# Patient Record
Sex: Female | Born: 1985
Health system: Southern US, Community
[De-identification: ages and names within clinical notes are randomized; demographics above are authoritative.]

## PROBLEM LIST (undated history)

## (undated) DIAGNOSIS — E119 Type 2 diabetes mellitus without complications: Secondary | ICD-10-CM

## (undated) DIAGNOSIS — D649 Anemia, unspecified: Secondary | ICD-10-CM

## (undated) DIAGNOSIS — R000 Tachycardia, unspecified: Secondary | ICD-10-CM

## (undated) DIAGNOSIS — R0602 Shortness of breath: Secondary | ICD-10-CM

## (undated) DIAGNOSIS — N184 Chronic kidney disease, stage 4 (severe): Secondary | ICD-10-CM

## (undated) DIAGNOSIS — I10 Essential (primary) hypertension: Secondary | ICD-10-CM

## (undated) HISTORY — DX: Chronic kidney disease, stage 4 (severe): N18.4

## (undated) HISTORY — DX: Shortness of breath: R06.02

---
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
## 2005-05-23 ENCOUNTER — Ambulatory Visit (HOSPITAL_COMMUNITY): Admission: AD | Admit: 2005-05-23 | Discharge: 2005-05-23 | Payer: Self-pay | Admitting: Obstetrics & Gynecology

## 2005-05-27 ENCOUNTER — Ambulatory Visit (HOSPITAL_COMMUNITY): Admission: AD | Admit: 2005-05-27 | Discharge: 2005-05-27 | Payer: Self-pay | Admitting: Obstetrics & Gynecology

## 2005-05-30 ENCOUNTER — Ambulatory Visit (HOSPITAL_COMMUNITY): Admission: AD | Admit: 2005-05-30 | Discharge: 2005-05-30 | Payer: Self-pay | Admitting: Obstetrics & Gynecology

## 2005-06-03 ENCOUNTER — Ambulatory Visit (HOSPITAL_COMMUNITY): Admission: AD | Admit: 2005-06-03 | Discharge: 2005-06-03 | Payer: Self-pay | Admitting: Obstetrics & Gynecology

## 2005-06-06 ENCOUNTER — Ambulatory Visit (HOSPITAL_COMMUNITY): Admission: AD | Admit: 2005-06-06 | Discharge: 2005-06-06 | Payer: Self-pay | Admitting: Obstetrics & Gynecology

## 2005-06-10 ENCOUNTER — Ambulatory Visit (HOSPITAL_COMMUNITY): Admission: AD | Admit: 2005-06-10 | Discharge: 2005-06-10 | Payer: Self-pay | Admitting: Obstetrics & Gynecology

## 2005-06-17 ENCOUNTER — Ambulatory Visit (HOSPITAL_COMMUNITY): Admission: AD | Admit: 2005-06-17 | Discharge: 2005-06-17 | Payer: Self-pay | Admitting: Obstetrics & Gynecology

## 2005-06-25 ENCOUNTER — Inpatient Hospital Stay (HOSPITAL_COMMUNITY): Admission: RE | Admit: 2005-06-25 | Discharge: 2005-06-28 | Payer: Self-pay | Admitting: Obstetrics & Gynecology

## 2005-07-05 ENCOUNTER — Encounter (HOSPITAL_COMMUNITY): Admission: RE | Admit: 2005-07-05 | Discharge: 2005-07-06 | Payer: Self-pay | Admitting: Obstetrics & Gynecology

## 2005-07-07 ENCOUNTER — Encounter (HOSPITAL_COMMUNITY): Admission: RE | Admit: 2005-07-07 | Discharge: 2005-08-06 | Payer: Self-pay | Admitting: Obstetrics & Gynecology

## 2011-01-23 ENCOUNTER — Other Ambulatory Visit (HOSPITAL_COMMUNITY): Payer: Self-pay | Admitting: Obstetrics and Gynecology

## 2011-01-23 DIAGNOSIS — O24919 Unspecified diabetes mellitus in pregnancy, unspecified trimester: Secondary | ICD-10-CM

## 2011-01-28 ENCOUNTER — Other Ambulatory Visit (HOSPITAL_COMMUNITY): Payer: Self-pay | Admitting: Obstetrics and Gynecology

## 2011-01-28 ENCOUNTER — Encounter (HOSPITAL_COMMUNITY): Payer: Self-pay

## 2011-01-28 ENCOUNTER — Ambulatory Visit (HOSPITAL_COMMUNITY)
Admission: RE | Admit: 2011-01-28 | Discharge: 2011-01-28 | Disposition: A | Discharge status: Home / Self Care | Payer: Medicaid Other | Source: Ambulatory Visit | Attending: Obstetrics and Gynecology | Admitting: Obstetrics and Gynecology

## 2011-01-28 VITALS — BP 117/84 | HR 113 | Wt 169.0 lb

## 2011-01-28 DIAGNOSIS — O34219 Maternal care for unspecified type scar from previous cesarean delivery: Secondary | ICD-10-CM | POA: Insufficient documentation

## 2011-01-28 DIAGNOSIS — Z363 Encounter for antenatal screening for malformations: Secondary | ICD-10-CM | POA: Insufficient documentation

## 2011-01-28 DIAGNOSIS — O24919 Unspecified diabetes mellitus in pregnancy, unspecified trimester: Secondary | ICD-10-CM

## 2011-01-28 DIAGNOSIS — O358XX0 Maternal care for other (suspected) fetal abnormality and damage, not applicable or unspecified: Secondary | ICD-10-CM | POA: Insufficient documentation

## 2011-01-28 DIAGNOSIS — Z1389 Encounter for screening for other disorder: Secondary | ICD-10-CM | POA: Insufficient documentation

## 2011-01-28 DIAGNOSIS — O2620 Pregnancy care for patient with recurrent pregnancy loss, unspecified trimester: Secondary | ICD-10-CM | POA: Insufficient documentation

## 2011-01-29 ENCOUNTER — Encounter (HOSPITAL_COMMUNITY): Payer: Self-pay

## 2011-01-30 ENCOUNTER — Other Ambulatory Visit (HOSPITAL_COMMUNITY): Payer: Self-pay | Admitting: Obstetrics and Gynecology

## 2011-01-30 DIAGNOSIS — O2620 Pregnancy care for patient with recurrent pregnancy loss, unspecified trimester: Secondary | ICD-10-CM

## 2011-01-30 DIAGNOSIS — O34219 Maternal care for unspecified type scar from previous cesarean delivery: Secondary | ICD-10-CM

## 2011-01-30 DIAGNOSIS — O24919 Unspecified diabetes mellitus in pregnancy, unspecified trimester: Secondary | ICD-10-CM

## 2011-03-11 ENCOUNTER — Ambulatory Visit (HOSPITAL_COMMUNITY)
Admission: RE | Admit: 2011-03-11 | Discharge: 2011-03-11 | Disposition: A | Discharge status: Home / Self Care | Payer: Medicaid Other | Source: Ambulatory Visit | Attending: Obstetrics and Gynecology | Admitting: Obstetrics and Gynecology

## 2011-03-11 DIAGNOSIS — O34219 Maternal care for unspecified type scar from previous cesarean delivery: Secondary | ICD-10-CM | POA: Insufficient documentation

## 2011-03-11 DIAGNOSIS — O2620 Pregnancy care for patient with recurrent pregnancy loss, unspecified trimester: Secondary | ICD-10-CM | POA: Insufficient documentation

## 2011-03-11 DIAGNOSIS — O24919 Unspecified diabetes mellitus in pregnancy, unspecified trimester: Secondary | ICD-10-CM | POA: Insufficient documentation

## 2011-04-08 ENCOUNTER — Ambulatory Visit (HOSPITAL_COMMUNITY)
Admission: RE | Admit: 2011-04-08 | Discharge: 2011-04-08 | Disposition: A | Discharge status: Home / Self Care | Payer: Medicaid Other | Source: Ambulatory Visit | Attending: Obstetrics and Gynecology | Admitting: Obstetrics and Gynecology

## 2011-04-08 DIAGNOSIS — O24919 Unspecified diabetes mellitus in pregnancy, unspecified trimester: Secondary | ICD-10-CM | POA: Insufficient documentation

## 2011-04-08 DIAGNOSIS — O34219 Maternal care for unspecified type scar from previous cesarean delivery: Secondary | ICD-10-CM | POA: Insufficient documentation

## 2011-04-08 NOTE — Progress Notes (Signed)
Ultrasound in [facility].  Follow up U/S scheduled

## 2011-05-20 ENCOUNTER — Ambulatory Visit (HOSPITAL_COMMUNITY)
Admission: RE | Admit: 2011-05-20 | Discharge: 2011-05-20 | Disposition: A | Discharge status: Home / Self Care | Payer: Medicaid Other | Source: Ambulatory Visit | Attending: Obstetrics and Gynecology | Admitting: Obstetrics and Gynecology

## 2011-05-20 ENCOUNTER — Other Ambulatory Visit (HOSPITAL_COMMUNITY): Payer: Self-pay | Admitting: Obstetrics and Gynecology

## 2011-05-20 DIAGNOSIS — O34219 Maternal care for unspecified type scar from previous cesarean delivery: Secondary | ICD-10-CM | POA: Insufficient documentation

## 2011-05-20 DIAGNOSIS — O2620 Pregnancy care for patient with recurrent pregnancy loss, unspecified trimester: Secondary | ICD-10-CM | POA: Insufficient documentation

## 2011-05-20 DIAGNOSIS — O24919 Unspecified diabetes mellitus in pregnancy, unspecified trimester: Secondary | ICD-10-CM

## 2011-05-20 NOTE — Progress Notes (Signed)
Obstetric ultrasound performed today.  Please see report in [facility].

## 2012-12-26 DIAGNOSIS — R112 Nausea with vomiting, unspecified: Secondary | ICD-10-CM

## 2014-01-04 ENCOUNTER — Emergency Department (HOSPITAL_COMMUNITY): Payer: Medicaid Other

## 2014-01-04 ENCOUNTER — Emergency Department (HOSPITAL_COMMUNITY)
Admission: EM | Admit: 2014-01-04 | Discharge: 2014-01-04 | Disposition: A | Discharge status: Nursing Home (Includes ICF) | Payer: Medicaid Other | Source: Home / Self Care | Attending: Family Medicine | Admitting: Family Medicine

## 2014-01-04 ENCOUNTER — Encounter (HOSPITAL_COMMUNITY): Payer: Self-pay | Admitting: Emergency Medicine

## 2014-01-04 ENCOUNTER — Emergency Department (HOSPITAL_COMMUNITY)
Admission: EM | Admit: 2014-01-04 | Discharge: 2014-01-05 | Disposition: A | Discharge status: Home / Self Care | Payer: Medicaid Other | Attending: Emergency Medicine | Admitting: Emergency Medicine

## 2014-01-04 DIAGNOSIS — Z79899 Other long term (current) drug therapy: Secondary | ICD-10-CM | POA: Insufficient documentation

## 2014-01-04 DIAGNOSIS — J3489 Other specified disorders of nose and nasal sinuses: Secondary | ICD-10-CM | POA: Insufficient documentation

## 2014-01-04 DIAGNOSIS — Z794 Long term (current) use of insulin: Secondary | ICD-10-CM | POA: Insufficient documentation

## 2014-01-04 DIAGNOSIS — R319 Hematuria, unspecified: Secondary | ICD-10-CM | POA: Insufficient documentation

## 2014-01-04 DIAGNOSIS — R739 Hyperglycemia, unspecified: Secondary | ICD-10-CM

## 2014-01-04 DIAGNOSIS — R05 Cough: Secondary | ICD-10-CM | POA: Insufficient documentation

## 2014-01-04 DIAGNOSIS — E109 Type 1 diabetes mellitus without complications: Secondary | ICD-10-CM

## 2014-01-04 DIAGNOSIS — I10 Essential (primary) hypertension: Secondary | ICD-10-CM | POA: Insufficient documentation

## 2014-01-04 DIAGNOSIS — R059 Cough, unspecified: Secondary | ICD-10-CM | POA: Insufficient documentation

## 2014-01-04 DIAGNOSIS — R300 Dysuria: Secondary | ICD-10-CM | POA: Insufficient documentation

## 2014-01-04 DIAGNOSIS — E119 Type 2 diabetes mellitus without complications: Secondary | ICD-10-CM | POA: Insufficient documentation

## 2014-01-04 DIAGNOSIS — E1065 Type 1 diabetes mellitus with hyperglycemia: Secondary | ICD-10-CM

## 2014-01-04 HISTORY — DX: Type 2 diabetes mellitus without complications: E11.9

## 2014-01-04 HISTORY — DX: Essential (primary) hypertension: I10

## 2014-01-04 LAB — POCT URINALYSIS DIP (DEVICE)
Bilirubin Urine: NEGATIVE
Glucose, UA: >=1000 mg/dL — AB
Ketones, ur: NEGATIVE mg/dL
Leukocytes, UA: NEGATIVE
NITRITE: NEGATIVE
PH: 7 (ref 5.0–8.0)
PROTEIN: NEGATIVE mg/dL
Specific Gravity, Urine: 1.015 (ref 1.005–1.030)
UROBILINOGEN UA: 0.2 mg/dL (ref 0.0–1.0)

## 2014-01-04 LAB — BASIC METABOLIC PANEL
Anion gap: 15 (ref 5–15)
BUN: 10 mg/dL (ref 6–23)
CALCIUM: 9.4 mg/dL (ref 8.4–10.5)
CO2: 22 mEq/L (ref 19–32)
CREATININE: 0.5 mg/dL (ref 0.50–1.10)
Chloride: 99 mEq/L (ref 96–112)
Glucose, Bld: 367 mg/dL — ABNORMAL HIGH (ref 70–99)
POTASSIUM: 4.1 meq/L (ref 3.7–5.3)
Sodium: 136 mEq/L — ABNORMAL LOW (ref 137–147)

## 2014-01-04 LAB — CBC WITH DIFFERENTIAL/PLATELET
BASOS PCT: 0 % (ref 0–1)
Basophils Absolute: 0 10*3/uL (ref 0.0–0.1)
EOS ABS: 0 10*3/uL (ref 0.0–0.7)
Eosinophils Relative: 0 % (ref 0–5)
HCT: 36.7 % (ref 36.0–46.0)
HEMOGLOBIN: 12.3 g/dL (ref 12.0–15.0)
Lymphocytes Relative: 29 % (ref 12–46)
Lymphs Abs: 2.1 10*3/uL (ref 0.7–4.0)
MCH: 28.3 pg (ref 26.0–34.0)
MCHC: 33.5 g/dL (ref 30.0–36.0)
MCV: 84.4 fL (ref 78.0–100.0)
MONOS PCT: 11 % (ref 3–12)
Monocytes Absolute: 0.8 10*3/uL (ref 0.1–1.0)
NEUTROS PCT: 60 % (ref 43–77)
Neutro Abs: 4.3 10*3/uL (ref 1.7–7.7)
Platelets: 233 10*3/uL (ref 150–400)
RBC: 4.35 MIL/uL (ref 3.87–5.11)
RDW: 12 % (ref 11.5–15.5)
WBC: 7.2 10*3/uL (ref 4.0–10.5)

## 2014-01-04 LAB — POCT I-STAT, CHEM 8
BUN: 8 mg/dL (ref 6–23)
CREATININE: 0.7 mg/dL (ref 0.50–1.10)
Calcium, Ion: 1.2 mmol/L (ref 1.12–1.23)
Chloride: 99 mEq/L (ref 96–112)
GLUCOSE: 442 mg/dL — AB (ref 70–99)
HCT: 43 % (ref 36.0–46.0)
HEMOGLOBIN: 14.6 g/dL (ref 12.0–15.0)
POTASSIUM: 4 meq/L (ref 3.7–5.3)
Sodium: 136 mEq/L — ABNORMAL LOW (ref 137–147)
TCO2: 25 mmol/L (ref 0–100)

## 2014-01-04 LAB — CBG MONITORING, ED
GLUCOSE-CAPILLARY: 165 mg/dL — AB (ref 70–99)
GLUCOSE-CAPILLARY: 352 mg/dL — AB (ref 70–99)

## 2014-01-04 LAB — RAPID STREP SCREEN (MED CTR MEBANE ONLY): Streptococcus, Group A Screen (Direct): NEGATIVE

## 2014-01-04 LAB — POCT PREGNANCY, URINE: Preg Test, Ur: NEGATIVE

## 2014-01-04 MED ORDER — SODIUM CHLORIDE 0.9 % IV BOLUS (SEPSIS)
1000.0000 mL | Freq: Once | INTRAVENOUS | Status: AC
  Administered 2014-01-04: 1000 mL via INTRAVENOUS

## 2014-01-04 MED ORDER — INSULIN ASPART 100 UNIT/ML ~~LOC~~ SOLN
10.0000 [IU] | Freq: Once | SUBCUTANEOUS | Status: AC
  Administered 2014-01-04: 21:00:00 via INTRAVENOUS
  Filled 2014-01-04: qty 1

## 2014-01-04 NOTE — ED Provider Notes (Signed)
CSN: [id number]     Arrival date & time 01/04/14  1642 History   First MD Initiated Contact with Patient 01/04/14 1751     Chief Complaint  Patient presents with  . Urinary Tract Infection   (Consider location/radiation/quality/duration/timing/severity/associated sxs/prior Treatment) HPI Comments: Patient is T1 diabetic who has run out of test strips. No CBGs at home. Taking meds as prescribed.  Patient is a 28 y.o. female presenting with frequency. The history is provided by the patient.  Urinary Frequency This is a new problem. Episode onset: 4 days. The problem occurs constantly. The problem has not changed since onset.Associated symptoms include abdominal pain. Pertinent negatives include no headaches. Associated symptoms comments: +back pain and hematuria.    Past Medical History  Diagnosis Date  . Diabetes mellitus without complication   . Hypertension    History reviewed. No pertinent past surgical history. No family history on file. History  Substance Use Topics  . Smoking status: Never Smoker   . Smokeless tobacco: Not on file  . Alcohol Use: Yes   OB History   Grav Para Term Preterm Abortions TAB SAB Ect Mult Living   5 1 1  0 3 0 3 0 0 1     Review of Systems  Constitutional: Negative.  Negative for fever.  HENT: Negative.   Eyes: Negative.   Respiratory: Negative.   Cardiovascular: Negative.   Gastrointestinal: Positive for abdominal pain. Negative for nausea, vomiting, diarrhea, constipation and blood in stool.  Endocrine: Positive for polyuria.  Genitourinary: Positive for frequency and hematuria. Negative for dysuria, flank pain, vaginal bleeding, vaginal discharge, vaginal pain and pelvic pain.  Musculoskeletal: Positive for back pain.  Skin: Negative.   Neurological: Negative for dizziness, weakness, light-headedness and headaches.    Allergies  Review of patient's allergies indicates no known allergies.  Home Medications   Prior to Admission  medications   Medication Sig Start Date End Date Taking? Authorizing Provider  insulin glargine (LANTUS) 100 UNIT/ML injection Inject 15 Units into the skin at bedtime.    Yes Historical Provider, MD  insulin lispro (HUMALOG) 100 UNIT/ML injection Inject 5 Units into the skin 3 (three) times daily before meals.    Yes Historical Provider, MD  METOPROLOL TARTRATE PO Take by mouth.   Yes Historical Provider, MD  lisinopril (PRINIVIL,ZESTRIL) 20 MG tablet Take 20 mg by mouth daily.    Historical Provider, MD  metFORMIN (GLUCOPHAGE) 1000 MG tablet Take 1,000 mg by mouth 2 (two) times daily with a meal.      Historical Provider, MD  metoprolol (LOPRESSOR) 50 MG tablet Take 50 mg by mouth daily.    Historical Provider, MD   BP 140/97  Pulse 104  Temp(Src) 98.1 F (36.7 C) (Oral)  Resp 16  SpO2 98%  Breastfeeding? Unknown Physical Exam  Nursing note and vitals reviewed. Constitutional: She is oriented to person, place, and time. She appears well-developed and well-nourished. No distress.  HENT:  Head: Normocephalic and atraumatic.  Cardiovascular: Normal rate, regular rhythm and normal heart sounds.   Pulmonary/Chest: Effort normal and breath sounds normal.  Abdominal: Soft. Bowel sounds are normal. She exhibits no distension. There is no tenderness. There is no CVA tenderness.  Musculoskeletal: Normal range of motion.  Neurological: She is alert and oriented to person, place, and time.  Skin: Skin is warm and dry.  Psychiatric: She has a normal mood and affect. Her behavior is normal.    ED Course  Procedures (including critical care time) Labs Review  Labs Reviewed  POCT URINALYSIS DIP (DEVICE) - Abnormal; Notable for the following:    Glucose, UA >=1000 (*)    Hgb urine dipstick SMALL (*)    All other components within normal limits  POCT I-STAT, CHEM 8 - Abnormal; Notable for the following:    Sodium 136 (*)    Glucose, Bld 442 (*)    All other components within normal limits   POCT PREGNANCY, URINE    Imaging Review Dg Chest 2 View  01/04/2014   CLINICAL DATA:  Cough and sore throat for 2 day  EXAM: CHEST  2 VIEW  COMPARISON:  None.  FINDINGS: Normal heart size and mediastinal contours. No acute infiltrate or edema. No effusion or pneumothorax. No acute osseous findings.  IMPRESSION: No active cardiopulmonary disease.   Electronically Signed   By: Jorje Guild M.D.   On: 01/04/2014 21:55     MDM   1. Hyperglycemia due to type 1 diabetes mellitus   UA only notable for glucosuria. No ketones Istat 8 consistent with severe hyperglycemia without anion gap Will transfer to [facility] ER for glucose stabilization   [place], [place] 01/04/14 2223

## 2014-01-04 NOTE — Discharge Instructions (Signed)

## 2014-01-04 NOTE — ED Notes (Deleted)
Pt reports insect sting to lower lip this am around 1000 Sx include swollen lip and tenderness Denies dyspnea and SOB Alert w/no signs of acute distress.

## 2014-01-04 NOTE — ED Notes (Signed)
Pt in from urgent care, went there due to frequent urination, once labs were obtained pt glucose level was 442, pt is a diabetic and has been taking her medication, but hasn't checked her glucose at home in 3-4 months due to not having test strips. No distress noted in triage.

## 2014-01-04 NOTE — ED Provider Notes (Signed)
CSN: [id number]     Arrival date & time 01/04/14  1920 History   First MD Initiated Contact with Patient 01/04/14 2023     Chief Complaint  Patient presents with  . Hyperglycemia     (Consider location/radiation/quality/duration/timing/severity/associated sxs/prior Treatment) HPI Comments: Patient referred to the emergency department for evaluation of elevated blood sugar. Patient went to urgent care today to be evaluated for hematuria and dysuria. Her urinalysis was clear, but she was found to have elevated blood sugar. She has been taking her medication as prescribed, but has not been checking her sugar because to have test strips. She is now experiencing abdominal pain, nausea, vomiting or diarrhea. She does have nasal congestion, sore throat and cough.  Patient is a 28 y.o. female presenting with hyperglycemia.  Hyperglycemia Associated symptoms: dysuria     Past Medical History  Diagnosis Date  . Diabetes mellitus without complication   . Hypertension    History reviewed. No pertinent past surgical history. History reviewed. No pertinent family history. History  Substance Use Topics  . Smoking status: Never Smoker   . Smokeless tobacco: Not on file  . Alcohol Use: Yes   OB History   Grav Para Term Preterm Abortions TAB SAB Ect Mult Living   5 1 1  0 3 0 3 0 0 1     Review of Systems  HENT: Positive for congestion.   Respiratory: Positive for cough.   Genitourinary: Positive for dysuria and hematuria.  All other systems reviewed and are negative.     Allergies  Review of patient's allergies indicates no known allergies.  Home Medications   Prior to Admission medications   Medication Sig Start Date End Date Taking? Authorizing Provider  insulin glargine (LANTUS) 100 UNIT/ML injection Inject 15 Units into the skin at bedtime.    Yes Historical Provider, MD  insulin lispro (HUMALOG) 100 UNIT/ML injection Inject 5 Units into the skin 3 (three) times daily before  meals.    Yes Historical Provider, MD  lisinopril (PRINIVIL,ZESTRIL) 20 MG tablet Take 20 mg by mouth daily.   Yes Historical Provider, MD  metFORMIN (GLUCOPHAGE) 1000 MG tablet Take 1,000 mg by mouth 2 (two) times daily with a meal.     Yes Historical Provider, MD  metoprolol (LOPRESSOR) 50 MG tablet Take 50 mg by mouth daily.   Yes Historical Provider, MD  METOPROLOL TARTRATE PO Take by mouth.    Historical Provider, MD   BP 121/94  Pulse 105  Temp(Src) 98.1 F (36.7 C) (Oral)  Resp 14  Wt 168 lb (76.204 kg)  SpO2 99% Physical Exam  Constitutional: She is oriented to person, place, and time. She appears well-developed and well-nourished. No distress.  HENT:  Head: Normocephalic and atraumatic.  Right Ear: Hearing normal.  Left Ear: Hearing normal.  Nose: Nose normal.  Mouth/Throat: Oropharynx is clear and moist and mucous membranes are normal.  Eyes: Conjunctivae and EOM are normal. Pupils are equal, round, and reactive to light.  Neck: Normal range of motion. Neck supple.  Cardiovascular: Regular rhythm, S1 normal and S2 normal.  Exam reveals no gallop and no friction rub.   No murmur heard. Pulmonary/Chest: Effort normal and breath sounds normal. No respiratory distress. She exhibits no tenderness.  Abdominal: Soft. Normal appearance and bowel sounds are normal. There is no hepatosplenomegaly. There is no tenderness. There is no rebound, no guarding, no tenderness at McBurney's point and negative Murphy's sign. No hernia.  Musculoskeletal: Normal range of motion.  Neurological:  She is alert and oriented to person, place, and time. She has normal strength. No cranial nerve deficit or sensory deficit. Coordination normal. GCS eye subscore is 4. GCS verbal subscore is 5. GCS motor subscore is 6.  Skin: Skin is warm, dry and intact. No rash noted. No cyanosis.  Psychiatric: She has a normal mood and affect. Her speech is normal and behavior is normal. Thought content normal.    ED  Course  Procedures (including critical care time) Labs Review Labs Reviewed  BASIC METABOLIC PANEL - Abnormal; Notable for the following:    Sodium 136 (*)    Glucose, Bld 367 (*)    All other components within normal limits  CBG MONITORING, ED - Abnormal; Notable for the following:    Glucose-Capillary 352 (*)    All other components within normal limits  CBG MONITORING, ED - Abnormal; Notable for the following:    Glucose-Capillary 165 (*)    All other components within normal limits  RAPID STREP SCREEN  CULTURE, GROUP A STREP  CBC WITH DIFFERENTIAL    Imaging Review Dg Chest 2 View  01/04/2014   CLINICAL DATA:  Cough and sore throat for 2 day  EXAM: CHEST  2 VIEW  COMPARISON:  None.  FINDINGS: Normal heart size and mediastinal contours. No acute infiltrate or edema. No effusion or pneumothorax. No acute osseous findings.  IMPRESSION: No active cardiopulmonary disease.   Electronically Signed   By: Jorje Guild M.D.   On: 01/04/2014 21:55     EKG Interpretation   Date/Time:  Wednesday January 04 2014 20:14:14 EDT Ventricular Rate:  102 PR Interval:  144 QRS Duration: 69 QT Interval:  328 QTC Calculation: 427 R Axis:   72 Text Interpretation:  Sinus tachycardia No previous tracing Confirmed by  POLLINA  MD, CHRISTOPHER [phone]) on 01/04/2014 8:23:30 PM      MDM   Final diagnoses:  None   hyperglycemia  Patient presents to the ER for evaluation of elevated blood sugar. Her labs are unremarkable other than hyperglycemia. No evidence of DKA. Patient hydrated, medicated and is now 165. She'll be discharged, continue her current regimen, followup with primary doctor.    Orpah Greek, MD 01/04/14 [phone]

## 2014-01-04 NOTE — ED Notes (Signed)
Pt reports poss UTI onset Sunday Sx include: abd/back pain and hematuria Denies dysuria, urinary freq/urgency, gyn sx, f/v/n/d Alert w/no signs of acute distress.

## 2014-01-05 LAB — CULTURE, GROUP A STREP

## 2014-01-05 NOTE — ED Provider Notes (Signed)
Medical screening examination/treatment/procedure(s) were performed by a resident physician or non-physician practitioner and as the supervising physician I was immediately available for consultation/collaboration.  Lynne Leader, MD    Gregor Hams, MD 01/05/14 [phone]

## 2014-04-03 ENCOUNTER — Encounter (HOSPITAL_COMMUNITY): Payer: Self-pay | Admitting: Emergency Medicine

## 2014-04-03 ENCOUNTER — Emergency Department (HOSPITAL_COMMUNITY)
Admission: EM | Admit: 2014-04-03 | Discharge: 2014-04-03 | Disposition: A | Discharge status: Home / Self Care | Payer: Medicaid Other | Attending: Emergency Medicine | Admitting: Emergency Medicine

## 2014-04-03 DIAGNOSIS — S339XXA Sprain of unspecified parts of lumbar spine and pelvis, initial encounter: Secondary | ICD-10-CM | POA: Insufficient documentation

## 2014-04-03 DIAGNOSIS — Y92410 Unspecified street and highway as the place of occurrence of the external cause: Secondary | ICD-10-CM | POA: Insufficient documentation

## 2014-04-03 DIAGNOSIS — Y9389 Activity, other specified: Secondary | ICD-10-CM | POA: Diagnosis not present

## 2014-04-03 DIAGNOSIS — E119 Type 2 diabetes mellitus without complications: Secondary | ICD-10-CM | POA: Diagnosis not present

## 2014-04-03 DIAGNOSIS — S39012A Strain of muscle, fascia and tendon of lower back, initial encounter: Secondary | ICD-10-CM

## 2014-04-03 DIAGNOSIS — S335XXA Sprain of ligaments of lumbar spine, initial encounter: Principal | ICD-10-CM

## 2014-04-03 DIAGNOSIS — IMO0002 Reserved for concepts with insufficient information to code with codable children: Secondary | ICD-10-CM | POA: Insufficient documentation

## 2014-04-03 DIAGNOSIS — Z794 Long term (current) use of insulin: Secondary | ICD-10-CM | POA: Insufficient documentation

## 2014-04-03 DIAGNOSIS — I10 Essential (primary) hypertension: Secondary | ICD-10-CM | POA: Diagnosis not present

## 2014-04-03 DIAGNOSIS — Z79899 Other long term (current) drug therapy: Secondary | ICD-10-CM | POA: Diagnosis not present

## 2014-04-03 MED ORDER — METHOCARBAMOL 750 MG PO TABS: 750.0000 mg | ORAL_TABLET | Freq: Three times a day (TID) | ORAL | Status: DC | PRN

## 2014-04-03 MED ORDER — METHOCARBAMOL 500 MG PO TABS
750.0000 mg | ORAL_TABLET | Freq: Once | ORAL | Status: AC
  Administered 2014-04-03: 750 mg via ORAL
  Filled 2014-04-03: qty 2

## 2014-04-03 MED ORDER — NAPROXEN 375 MG PO TABS: 375.0000 mg | ORAL_TABLET | Freq: Three times a day (TID) | ORAL | Status: DC

## 2014-04-03 MED ORDER — NAPROXEN 250 MG PO TABS
375.0000 mg | ORAL_TABLET | Freq: Once | ORAL | Status: AC
  Administered 2014-04-03: 375 mg via ORAL
  Filled 2014-04-03: qty 2
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
## 2014-04-03 NOTE — Discharge Instructions (Signed)
Cryotherapy Cryotherapy is when you put ice on your injury. Ice helps lessen pain and puffiness (swelling) after an injury. Ice works the best when you start using it in the first 24 to 48 hours after an injury. HOME CARE  Put a dry or damp towel between the ice pack and your skin.  You may press gently on the ice pack.  Leave the ice on for no more than 10 to 20 minutes at a time.  Check your skin after 5 minutes to make sure your skin is okay.  Rest at least 20 minutes between ice pack uses.  Stop using ice when your skin loses feeling (numbness).  Do not use ice on someone who cannot tell you when it hurts. This includes small children and people with memory problems (dementia). GET HELP RIGHT AWAY IF:  You have white spots on your skin.  Your skin turns blue or pale.  Your skin feels waxy or hard.  Your puffiness gets worse. MAKE SURE YOU:   Understand these instructions.  Will watch your condition.  Will get help right away if you are not doing well or get worse. Document Released: 12/10/2007 Document Revised: 09/15/2011 Document Reviewed: 02/13/2011 [facility] Patient Information 2015 [place], [place]. This information is not intended to replace advice given to you by your health care provider. Make sure you discuss any questions you have with your health care provider.  Back Exercises Back exercises help treat and prevent back injuries. The goal is to increase your strength in your belly (abdominal) and back muscles. These exercises can also help with flexibility. Start these exercises when told by your doctor. HOME CARE Back exercises include: Pelvic Tilt.  Lie on your back with your knees bent. Tilt your pelvis until the lower part of your back is against the floor. Hold this position 5 to 10 sec. Repeat this exercise 5 to 10 times. Knee to Chest.  Pull 1 knee up against your chest and hold for 20 to 30 seconds. Repeat this with the other knee. This may be done with  the other leg straight or bent, whichever feels better. Then, pull both knees up against your chest. Sit-Ups or Curl-Ups.  Bend your knees 90 degrees. Start with tilting your pelvis, and do a partial, slow sit-up. Only lift your upper half 30 to 45 degrees off the floor. Take at least 2 to 3 seonds for each sit-up. Do not do sit-ups with your knees out straight. If partial sit-ups are difficult, simply do the above but with only tightening your belly (abdominal) muscles and holding it as told. Hip-Lift.  Lie on your back with your knees flexed 90 degrees. Push down with your feet and shoulders as you raise your hips 2 inches off the floor. Hold for 10 seconds, repeat 5 to 10 times. Back Arches.  Lie on your stomach. Prop yourself up on bent elbows. Slowly press on your hands, causing an arch in your low back. Repeat 3 to 5 times. Shoulder-Lifts.  Lie face down with arms beside your body. Keep hips and belly pressed to floor as you slowly lift your head and shoulders off the floor. Do not overdo your exercises. Be careful in the beginning. Exercises may cause you some mild back discomfort. If the pain lasts for more than 15 minutes, stop the exercises until you see your doctor. Improvement with exercise for back problems is slow.  Document Released: 07/26/2010 Document Revised: 09/15/2011 Document Reviewed: 04/24/2011 [employer] Patient Information 2015 [employer]. This information is not intended to replace advice given to you by your health care provider. Make sure you discuss any questions you have with your health care provider.

## 2014-04-05 NOTE — ED Provider Notes (Signed)
Medical screening examination/treatment/procedure(s) were performed by non-physician practitioner and as supervising physician I was immediately available for consultation/collaboration.   EKG Interpretation None       Babette Relic, MD 04/05/14 1319

## 2014-05-08 ENCOUNTER — Encounter (HOSPITAL_COMMUNITY): Payer: Self-pay | Admitting: Emergency Medicine

## 2014-05-10 ENCOUNTER — Encounter (HOSPITAL_COMMUNITY): Payer: Self-pay | Admitting: Emergency Medicine

## 2014-05-10 ENCOUNTER — Emergency Department (HOSPITAL_COMMUNITY)
Admission: EM | Admit: 2014-05-10 | Discharge: 2014-05-10 | Disposition: A | Discharge status: Home / Self Care | Payer: Medicaid Other | Attending: Emergency Medicine | Admitting: Emergency Medicine

## 2014-05-10 DIAGNOSIS — M549 Dorsalgia, unspecified: Secondary | ICD-10-CM | POA: Diagnosis present

## 2014-05-10 DIAGNOSIS — M545 Low back pain, unspecified: Secondary | ICD-10-CM

## 2014-05-10 DIAGNOSIS — I10 Essential (primary) hypertension: Secondary | ICD-10-CM | POA: Insufficient documentation

## 2014-05-10 DIAGNOSIS — E119 Type 2 diabetes mellitus without complications: Secondary | ICD-10-CM | POA: Diagnosis not present

## 2014-05-10 DIAGNOSIS — Z791 Long term (current) use of non-steroidal anti-inflammatories (NSAID): Secondary | ICD-10-CM | POA: Diagnosis not present

## 2014-05-10 DIAGNOSIS — Z794 Long term (current) use of insulin: Secondary | ICD-10-CM | POA: Diagnosis not present

## 2014-05-10 DIAGNOSIS — Z79899 Other long term (current) drug therapy: Secondary | ICD-10-CM | POA: Diagnosis not present

## 2014-05-10 MED ORDER — CYCLOBENZAPRINE HCL 10 MG PO TABS
10.0000 mg | ORAL_TABLET | Freq: Once | ORAL | Status: AC
  Administered 2014-05-10: 10 mg via ORAL
  Filled 2014-05-10: qty 1

## 2014-05-10 MED ORDER — NAPROXEN 500 MG PO TABS: 500.0000 mg | ORAL_TABLET | Freq: Two times a day (BID) | ORAL | Status: DC

## 2014-05-10 MED ORDER — CYCLOBENZAPRINE HCL 10 MG PO TABS: 10.0000 mg | ORAL_TABLET | Freq: Two times a day (BID) | ORAL | Status: DC | PRN

## 2014-05-10 MED ORDER — NAPROXEN 250 MG PO TABS
500.0000 mg | ORAL_TABLET | Freq: Once | ORAL | Status: AC
  Administered 2014-05-10: 500 mg via ORAL
  Filled 2014-05-10: qty 2
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
## 2014-05-10 NOTE — ED Notes (Signed)
Patient states heart rate always high, but she has not taken her Metoprolol for 2 months.

## 2014-05-10 NOTE — Discharge Instructions (Signed)
Please follow the directions provided. Be sure to follow-up with the Ortho referral given if your back pain does not improve. You may take the Naprosyn twice a day for pain and the Flexeril twice a day for muscle spasms. Don't hesitate to return for new, worsening, or concerning symptoms.  SEEK IMMEDIATE MEDICAL CARE IF:  You have pain that radiates from your back into your legs.  You develop new bowel or bladder control problems.  You have unusual weakness or numbness in your arms or legs.  You develop nausea or vomiting.  You develop abdominal pain.  You feel faint.
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
## 2014-05-14 ENCOUNTER — Encounter (HOSPITAL_COMMUNITY): Payer: Self-pay | Admitting: Emergency Medicine

## 2014-05-14 ENCOUNTER — Inpatient Hospital Stay (HOSPITAL_COMMUNITY)
Admission: EM | Admit: 2014-05-14 | Discharge: 2014-05-17 | DRG: 872 | Disposition: A | Discharge status: Home / Self Care | Payer: Medicaid Other | Attending: Internal Medicine | Admitting: Internal Medicine

## 2014-05-14 ENCOUNTER — Emergency Department (HOSPITAL_COMMUNITY): Payer: Medicaid Other

## 2014-05-14 ENCOUNTER — Emergency Department (HOSPITAL_COMMUNITY)
Admission: EM | Admit: 2014-05-14 | Discharge: 2014-05-14 | Disposition: A | Discharge status: Nursing Home (Includes ICF) | Payer: Medicaid Other | Source: Home / Self Care | Attending: Family Medicine | Admitting: Family Medicine

## 2014-05-14 ENCOUNTER — Encounter (HOSPITAL_COMMUNITY): Payer: Self-pay

## 2014-05-14 DIAGNOSIS — D649 Anemia, unspecified: Secondary | ICD-10-CM

## 2014-05-14 DIAGNOSIS — R10A1 Flank pain, right side: Secondary | ICD-10-CM

## 2014-05-14 DIAGNOSIS — D72829 Elevated white blood cell count, unspecified: Secondary | ICD-10-CM

## 2014-05-14 DIAGNOSIS — E1122 Type 2 diabetes mellitus with diabetic chronic kidney disease: Secondary | ICD-10-CM

## 2014-05-14 DIAGNOSIS — E1165 Type 2 diabetes mellitus with hyperglycemia: Secondary | ICD-10-CM | POA: Diagnosis present

## 2014-05-14 DIAGNOSIS — Z91120 Patient's intentional underdosing of medication regimen due to financial hardship: Secondary | ICD-10-CM | POA: Diagnosis present

## 2014-05-14 DIAGNOSIS — R000 Tachycardia, unspecified: Secondary | ICD-10-CM

## 2014-05-14 DIAGNOSIS — N12 Tubulo-interstitial nephritis, not specified as acute or chronic: Secondary | ICD-10-CM

## 2014-05-14 DIAGNOSIS — D500 Iron deficiency anemia secondary to blood loss (chronic): Secondary | ICD-10-CM | POA: Diagnosis present

## 2014-05-14 DIAGNOSIS — E119 Type 2 diabetes mellitus without complications: Secondary | ICD-10-CM

## 2014-05-14 DIAGNOSIS — A4151 Sepsis due to Escherichia coli [E. coli]: Principal | ICD-10-CM | POA: Diagnosis present

## 2014-05-14 DIAGNOSIS — R7881 Bacteremia: Secondary | ICD-10-CM | POA: Clinically undetermined

## 2014-05-14 DIAGNOSIS — R109 Unspecified abdominal pain: Secondary | ICD-10-CM

## 2014-05-14 DIAGNOSIS — Z794 Long term (current) use of insulin: Secondary | ICD-10-CM

## 2014-05-14 DIAGNOSIS — I10 Essential (primary) hypertension: Secondary | ICD-10-CM | POA: Diagnosis present

## 2014-05-14 DIAGNOSIS — N920 Excessive and frequent menstruation with regular cycle: Secondary | ICD-10-CM | POA: Diagnosis present

## 2014-05-14 DIAGNOSIS — E872 Acidosis: Secondary | ICD-10-CM | POA: Diagnosis present

## 2014-05-14 DIAGNOSIS — A419 Sepsis, unspecified organism: Secondary | ICD-10-CM

## 2014-05-14 HISTORY — DX: Tachycardia, unspecified: R00.0

## 2014-05-14 HISTORY — DX: Anemia, unspecified: D64.9

## 2014-05-14 LAB — I-STAT CG4 LACTIC ACID, ED: Lactic Acid, Venous: 1.96 mmol/L (ref 0.5–2.2)

## 2014-05-14 LAB — CBC WITH DIFFERENTIAL/PLATELET
Basophils Absolute: 0 10*3/uL (ref 0.0–0.1)
Basophils Absolute: 0 10*3/uL (ref 0.0–0.1)
Basophils Relative: 0 % (ref 0–1)
Basophils Relative: 0 % (ref 0–1)
EOS PCT: 0 % (ref 0–5)
EOS PCT: 0 % (ref 0–5)
Eosinophils Absolute: 0 10*3/uL (ref 0.0–0.7)
Eosinophils Absolute: 0 10*3/uL (ref 0.0–0.7)
HCT: 31.2 % — ABNORMAL LOW (ref 36.0–46.0)
HEMATOCRIT: 31.1 % — AB (ref 36.0–46.0)
Hemoglobin: 10.1 g/dL — ABNORMAL LOW (ref 12.0–15.0)
Hemoglobin: 9.8 g/dL — ABNORMAL LOW (ref 12.0–15.0)
LYMPHS ABS: 1.6 10*3/uL (ref 0.7–4.0)
LYMPHS ABS: 1.6 10*3/uL (ref 0.7–4.0)
Lymphocytes Relative: 8 % — ABNORMAL LOW (ref 12–46)
Lymphocytes Relative: 9 % — ABNORMAL LOW (ref 12–46)
MCH: 26.5 pg (ref 26.0–34.0)
MCH: 26 pg (ref 26.0–34.0)
MCHC: 32.4 g/dL (ref 30.0–36.0)
MCHC: 31.5 g/dL (ref 30.0–36.0)
MCV: 81.9 fL (ref 78.0–100.0)
MCV: 82.5 fL (ref 78.0–100.0)
MONO ABS: 1.2 10*3/uL — AB (ref 0.1–1.0)
MONO ABS: 1 10*3/uL (ref 0.1–1.0)
MONOS PCT: 6 % (ref 3–12)
Monocytes Relative: 5 % (ref 3–12)
Neutro Abs: 17 10*3/uL — ABNORMAL HIGH (ref 1.7–7.7)
Neutro Abs: 15.6 10*3/uL — ABNORMAL HIGH (ref 1.7–7.7)
Neutrophils Relative %: 86 % — ABNORMAL HIGH (ref 43–77)
Neutrophils Relative %: 86 % — ABNORMAL HIGH (ref 43–77)
Platelets: 342 10*3/uL (ref 150–400)
Platelets: 305 10*3/uL (ref 150–400)
RBC: 3.81 MIL/uL — AB (ref 3.87–5.11)
RBC: 3.77 MIL/uL — AB (ref 3.87–5.11)
RDW: 12.9 % (ref 11.5–15.5)
RDW: 13.2 % (ref 11.5–15.5)
WBC: 19.8 10*3/uL — ABNORMAL HIGH (ref 4.0–10.5)
WBC: 18.2 10*3/uL — ABNORMAL HIGH (ref 4.0–10.5)

## 2014-05-14 LAB — POCT PREGNANCY, URINE: Preg Test, Ur: NEGATIVE

## 2014-05-14 LAB — BASIC METABOLIC PANEL
Anion gap: 18 — ABNORMAL HIGH (ref 5–15)
BUN: 6 mg/dL (ref 6–23)
CO2: 21 meq/L (ref 19–32)
Calcium: 8.8 mg/dL (ref 8.4–10.5)
Chloride: 96 mEq/L (ref 96–112)
Creatinine, Ser: 0.56 mg/dL (ref 0.50–1.10)
GFR calc Af Amer: >90 mL/min (ref >90)
GLUCOSE: 254 mg/dL — AB (ref 70–99)
POTASSIUM: 4.4 meq/L (ref 3.7–5.3)
Sodium: 135 mEq/L — ABNORMAL LOW (ref 137–147)

## 2014-05-14 LAB — POCT URINALYSIS DIP (DEVICE)
Glucose, UA: 500 mg/dL — AB
Ketones, ur: 80 mg/dL — AB
Leukocytes, UA: NEGATIVE
NITRITE: NEGATIVE
PH: 6 (ref 5.0–8.0)
Protein, ur: >=300 mg/dL — AB
Specific Gravity, Urine: >=1.03 (ref 1.005–1.030)
Urobilinogen, UA: 4 mg/dL — ABNORMAL HIGH (ref 0.0–1.0)

## 2014-05-14 LAB — URINALYSIS, ROUTINE W REFLEX MICROSCOPIC
BILIRUBIN URINE: NEGATIVE
Leukocytes, UA: NEGATIVE
Nitrite: NEGATIVE
PROTEIN: 100 mg/dL — AB
Specific Gravity, Urine: 1.03 (ref 1.005–1.030)
Urobilinogen, UA: 1 mg/dL (ref 0.0–1.0)
pH: 6 (ref 5.0–8.0)

## 2014-05-14 LAB — URINE MICROSCOPIC-ADD ON

## 2014-05-14 LAB — TROPONIN I: Troponin I: <0.3 ng/mL (ref <0.30)

## 2014-05-14 LAB — I-STAT VENOUS BLOOD GAS, ED
Acid-base deficit: 4 mmol/L — ABNORMAL HIGH (ref 0.0–2.0)
Bicarbonate: 22.2 mEq/L (ref 20.0–24.0)
O2 SAT: 19 %
TCO2: 24 mmol/L (ref 0–100)
pCO2, Ven: 43.2 mmHg — ABNORMAL LOW (ref 45.0–50.0)
pH, Ven: 7.319 — ABNORMAL HIGH (ref 7.250–7.300)
pO2, Ven: 16 mmHg — CL (ref 30.0–45.0)

## 2014-05-14 LAB — LIPASE, BLOOD: Lipase: 7 U/L — ABNORMAL LOW (ref 11–59)

## 2014-05-14 LAB — KETONES, QUALITATIVE

## 2014-05-14 LAB — TSH: TSH: 0.919 u[IU]/mL (ref 0.350–4.500)

## 2014-05-14 LAB — POCT I-STAT, CHEM 8
BUN: 4 mg/dL — ABNORMAL LOW (ref 6–23)
Calcium, Ion: 1.15 mmol/L (ref 1.12–1.23)
Chloride: 100 mEq/L (ref 96–112)
Creatinine, Ser: 0.6 mg/dL (ref 0.50–1.10)
Glucose, Bld: 284 mg/dL — ABNORMAL HIGH (ref 70–99)
HCT: 34 % — ABNORMAL LOW (ref 36.0–46.0)
Hemoglobin: 11.6 g/dL — ABNORMAL LOW (ref 12.0–15.0)
Potassium: 3.9 mEq/L (ref 3.7–5.3)
SODIUM: 135 meq/L — AB (ref 137–147)
TCO2: 22 mmol/L (ref 0–100)

## 2014-05-14 LAB — D-DIMER, QUANTITATIVE: D-Dimer, Quant: 0.83 ug/mL-FEU — ABNORMAL HIGH (ref 0.00–0.48)

## 2014-05-14 MED ORDER — SODIUM CHLORIDE 0.9 % IV SOLN
INTRAVENOUS | Status: AC
  Administered 2014-05-14: via INTRAVENOUS

## 2014-05-14 MED ORDER — NAPROXEN 375 MG PO TABS
375.0000 mg | ORAL_TABLET | Freq: Three times a day (TID) | ORAL | Status: DC | PRN
  Administered 2014-05-15: 375 mg via ORAL
  Filled 2014-05-14 (×2): qty 1

## 2014-05-14 MED ORDER — INSULIN ASPART 100 UNIT/ML ~~LOC~~ SOLN
0.0000 [IU] | Freq: Three times a day (TID) | SUBCUTANEOUS | Status: DC
Start: 2014-05-15 — End: 2014-05-15
  Administered 2014-05-15: 5 [IU] via SUBCUTANEOUS

## 2014-05-14 MED ORDER — ONDANSETRON 4 MG PO TBDP
8.0000 mg | ORAL_TABLET | Freq: Once | ORAL | Status: AC
  Administered 2014-05-14: 8 mg via ORAL

## 2014-05-14 MED ORDER — METFORMIN HCL 500 MG PO TABS: 1000.0000 mg | ORAL_TABLET | Freq: Two times a day (BID) | ORAL | Status: DC

## 2014-05-14 MED ORDER — LISINOPRIL 20 MG PO TABS
20.0000 mg | ORAL_TABLET | Freq: Every day | ORAL | Status: DC
  Administered 2014-05-15: 20 mg via ORAL
  Filled 2014-05-14: qty 1

## 2014-05-14 MED ORDER — SODIUM CHLORIDE 0.9 % IV BOLUS (SEPSIS)
1000.0000 mL | Freq: Once | INTRAVENOUS | Status: AC
  Administered 2014-05-14: 1000 mL via INTRAVENOUS

## 2014-05-14 MED ORDER — ONDANSETRON 4 MG PO TBDP
ORAL_TABLET | ORAL | Status: AC
  Filled 2014-05-14: qty 2

## 2014-05-14 MED ORDER — ONDANSETRON HCL 4 MG/2ML IJ SOLN
4.0000 mg | Freq: Four times a day (QID) | INTRAMUSCULAR | Status: DC | PRN
  Administered 2014-05-15 (×2): 4 mg via INTRAVENOUS
  Filled 2014-05-14 (×2): qty 2

## 2014-05-14 MED ORDER — METOPROLOL TARTRATE 50 MG PO TABS
50.0000 mg | ORAL_TABLET | Freq: Two times a day (BID) | ORAL | Status: DC
  Administered 2014-05-14 – 2014-05-15 (×2): 50 mg via ORAL
  Filled 2014-05-14 (×3): qty 1

## 2014-05-14 MED ORDER — DEXTROSE 5 % IV SOLN
1.0000 g | INTRAVENOUS | Status: DC
  Filled 2014-05-14: qty 10

## 2014-05-14 MED ORDER — ENOXAPARIN SODIUM 40 MG/0.4ML ~~LOC~~ SOLN
40.0000 mg | SUBCUTANEOUS | Status: DC
  Administered 2014-05-14: 40 mg via SUBCUTANEOUS
  Filled 2014-05-14 (×4): qty 0.4

## 2014-05-14 MED ORDER — ACETAMINOPHEN 325 MG PO TABS
650.0000 mg | ORAL_TABLET | Freq: Four times a day (QID) | ORAL | Status: DC | PRN
  Administered 2014-05-14: 650 mg via ORAL

## 2014-05-14 MED ORDER — HYDROMORPHONE HCL 1 MG/ML IJ SOLN: 0.5000 mg | INTRAMUSCULAR | Status: DC | PRN

## 2014-05-14 MED ORDER — CYCLOBENZAPRINE HCL 10 MG PO TABS: 10.0000 mg | ORAL_TABLET | Freq: Two times a day (BID) | ORAL | Status: DC | PRN

## 2014-05-14 MED ORDER — METFORMIN HCL 500 MG PO TABS
1000.0000 mg | ORAL_TABLET | Freq: Two times a day (BID) | ORAL | Status: DC
  Administered 2014-05-15: 1000 mg via ORAL
  Filled 2014-05-14 (×3): qty 2

## 2014-05-14 MED ORDER — CEFTRIAXONE SODIUM 1 G IJ SOLR
1.0000 g | Freq: Once | INTRAMUSCULAR | Status: AC
  Administered 2014-05-14: 1 g via INTRAVENOUS
  Filled 2014-05-14: qty 10

## 2014-05-14 MED ORDER — ACETAMINOPHEN 650 MG RE SUPP: 650.0000 mg | Freq: Four times a day (QID) | RECTAL | Status: DC | PRN

## 2014-05-14 MED ORDER — INSULIN ASPART 100 UNIT/ML ~~LOC~~ SOLN
0.0000 [IU] | Freq: Every day | SUBCUTANEOUS | Status: DC
  Administered 2014-05-14: 3 [IU] via SUBCUTANEOUS

## 2014-05-14 MED ORDER — SODIUM CHLORIDE 0.9 % IJ SOLN
3.0000 mL | Freq: Two times a day (BID) | INTRAMUSCULAR | Status: DC
  Administered 2014-05-16 (×2): 3 mL via INTRAVENOUS

## 2014-05-14 MED ORDER — INSULIN GLARGINE 100 UNIT/ML ~~LOC~~ SOLN
15.0000 [IU] | Freq: Every day | SUBCUTANEOUS | Status: DC
  Administered 2014-05-14 – 2014-05-15 (×2): 15 [IU] via SUBCUTANEOUS
  Filled 2014-05-14 (×3): qty 0.15

## 2014-05-14 NOTE — ED Notes (Signed)
Patient states she is feeling nausea and having some flank pain That started today
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
## 2014-05-14 NOTE — ED Notes (Signed)
IV was attempted with a 20g catheter in the patient's left AC x2 without success. Patient was extremely dehydrated. Patient tolerated the attempt well.

## 2014-05-14 NOTE — ED Notes (Signed)
Pt c/o low back pain and nausea onset today. Pt sent from urgent care for further eval of dehydration and possible kidney infection.

## 2014-05-14 NOTE — H&P (Signed)
Jane Santiago is an 28 y.o. female.    Pcp: unassigned,  Pt is going to go to [facility] in near future  Chief Complaint: right flank pain HPI: 28 yo female with Dm2, c/o right flank pain starting today,  Denies fever, chills, n/v, diarrhea, constipation, brbpr, black stool, dysuria, hematuria.  Pt found to have uti. / pyelonephritis.   Pt noted to be tachycardic in the ED to 150,  Sinus tach.  Pt is asymptomatic from tachycardia.  Denies cp, palp, sob.  Pt has had mild tachycardia in the past but not this fast in terms of rate.  Pt will be admitted for w/up of uti, and tachycardia.   Past Medical History  Diagnosis Date  . Diabetes mellitus without complication   . Hypertension   . Anemia   . Tachycardia     Past Surgical History  Procedure Laterality Date  . Cesarean section      x2    Family History  Problem Relation Age of Onset  . Thyroid disease Mother   . Diabetes Father    Social History:  reports that she has never smoked. She does not have any smokeless tobacco history on file. She reports that she drinks alcohol. She reports that she does not use illicit drugs.  Allergies: No Known Allergies   (Not in a hospital admission)  Results for orders placed or performed during the hospital encounter of 05/14/14 (from the past 48 hour(s))  Basic metabolic panel     Status: Abnormal   Collection Time: 05/14/14  4:05 PM  Result Value Ref Range   Sodium 135 (L) 137 - 147 mEq/L   Potassium 4.4 3.7 - 5.3 mEq/L   Chloride 96 96 - 112 mEq/L   CO2 21 19 - 32 mEq/L   Glucose, Bld 254 (H) 70 - 99 mg/dL   BUN 6 6 - 23 mg/dL   Creatinine, Ser 0.56 0.50 - 1.10 mg/dL   Calcium 8.8 8.4 - 10.5 mg/dL   GFR calc non Af Amer >90 >90 mL/min   GFR calc Af Amer >90 >90 mL/min    Comment: (NOTE) The eGFR has been calculated using the CKD EPI equation. This calculation has not been validated in all clinical situations. eGFR's persistently <90 mL/min signify possible Chronic Kidney Disease.     Anion gap 18 (H) 5 - 15  CBC with Differential     Status: Abnormal   Collection Time: 05/14/14  4:05 PM  Result Value Ref Range   WBC 18.2 (H) 4.0 - 10.5 K/uL   RBC 3.77 (L) 3.87 - 5.11 MIL/uL   Hemoglobin 9.8 (L) 12.0 - 15.0 g/dL   HCT 31.1 (L) 36.0 - 46.0 %   MCV 82.5 78.0 - 100.0 fL   MCH 26.0 26.0 - 34.0 pg   MCHC 31.5 30.0 - 36.0 g/dL   RDW 13.2 11.5 - 15.5 %   Platelets 305 150 - 400 K/uL   Neutrophils Relative % 86 (H) 43 - 77 %   Neutro Abs 15.6 (H) 1.7 - 7.7 K/uL   Lymphocytes Relative 9 (L) 12 - 46 %   Lymphs Abs 1.6 0.7 - 4.0 K/uL   Monocytes Relative 5 3 - 12 %   Monocytes Absolute 1.0 0.1 - 1.0 K/uL   Eosinophils Relative 0 0 - 5 %   Eosinophils Absolute 0.0 0.0 - 0.7 K/uL   Basophils Relative 0 0 - 1 %   Basophils Absolute 0.0 0.0 - 0.1 K/uL  Ketones, qualitative     Status: Abnormal   Collection Time: 05/14/14  4:05 PM  Result Value Ref Range   Acetone, Bld SMALL (A) NEGATIVE  Lipase, blood     Status: Abnormal   Collection Time: 05/14/14  4:05 PM  Result Value Ref Range   Lipase 7 (L) 11 - 59 U/L  I-Stat venous blood gas, ED     Status: Abnormal   Collection Time: 05/14/14  4:53 PM  Result Value Ref Range   pH, Ven 7.319 (H) 7.250 - 7.300   pCO2, Ven 43.2 (L) 45.0 - 50.0 mmHg   pO2, Ven 16.0 (LL) 30.0 - 45.0 mmHg   Bicarbonate 22.2 20.0 - 24.0 mEq/L   TCO2 24 0 - 100 mmol/L   O2 Saturation 19.0 %   Acid-base deficit 4.0 (H) 0.0 - 2.0 mmol/L   Sample type VENOUS    Comment NOTIFIED PHYSICIAN   I-Stat CG4 Lactic Acid, ED     Status: None   Collection Time: 05/14/14  4:54 PM  Result Value Ref Range   Lactic Acid, Venous 1.96 0.5 - 2.2 mmol/L   Ct Abdomen Pelvis Wo Contrast  05/14/2014   CLINICAL DATA:  Rt flank pain radiates to rlq abd pain that started today. Pt states there was blood in her urine.  EXAM: CT ABDOMEN AND PELVIS WITHOUT CONTRAST  TECHNIQUE: Multidetector CT imaging of the abdomen and pelvis was performed following the standard protocol  without IV contrast.  COMPARISON:  None.  FINDINGS: There are no renal or ureteral stones.  There is no hydronephrosis.  On the right, there is perinephric stranding. No perinephric fluid collection.  No renal masses.  Bladder is unremarkable.  Clear lung bases.  Heart is normal in size.  Normal liver, spleen, gallbladder and pancreas. No bile duct dilation. No adrenal masses.  Uterus contains a well-positioned IUD. No uterine masses. No adnexal masses.  No pathologically enlarged lymph nodes. No abnormal fluid collections.  Normal bowel.  Normal appendix.  No bony abnormality.  IMPRESSION: 1. Right perinephric stranding. No renal or ureteral stones or obstructive uropathy. Findings are consistent with right pyelonephritis. No perinephric abscess. 2. No other abnormalities.   Electronically Signed   By: Lajean Manes M.D.   On: 05/14/2014 18:50    Review of Systems  Constitutional: Negative for fever, chills, weight loss, malaise/fatigue and diaphoresis.  HENT: Negative for congestion, ear discharge, ear pain, hearing loss, nosebleeds, sore throat and tinnitus.   Eyes: Negative for blurred vision, double vision, photophobia, pain, discharge and redness.  Respiratory: Negative for cough, hemoptysis, sputum production, shortness of breath, wheezing and stridor.   Cardiovascular: Negative for chest pain, palpitations, orthopnea, claudication, leg swelling and PND.  Gastrointestinal: Positive for nausea. Negative for heartburn, vomiting, abdominal pain, diarrhea, constipation, blood in stool and melena.  Genitourinary: Positive for flank pain. Negative for dysuria, urgency, frequency and hematuria.  Musculoskeletal: Negative for myalgias, back pain, joint pain, falls and neck pain.  Skin: Negative for itching and rash.  Neurological: Negative for dizziness, tingling, tremors, sensory change, speech change, focal weakness, seizures, loss of consciousness, weakness and headaches.  Endo/Heme/Allergies:  Negative for environmental allergies and polydipsia. Does not bruise/bleed easily.  Psychiatric/Behavioral: Negative for depression, suicidal ideas, hallucinations, memory loss and substance abuse. The patient is not nervous/anxious and does not have insomnia.     Blood pressure 118/59, pulse 132, temperature 99.8 F (37.7 C), temperature source Oral, resp. rate 17, height _0  (1.549 m), weight 75.297 kg (  166 lb), SpO2 100 %, unknown if currently breastfeeding. Physical Exam  Constitutional: She is oriented to person, place, and time. She appears well-developed and well-nourished.  HENT:  Head: Normocephalic and atraumatic.  Eyes: Conjunctivae and EOM are normal. Pupils are equal, round, and reactive to light.  Neck: Normal range of motion. Neck supple. No JVD present. No tracheal deviation present. No thyromegaly present.  Cardiovascular: Exam reveals no gallop and no friction rub.   No murmur heard. Tachy s1, s2  Respiratory: Effort normal and breath sounds normal. No stridor. No respiratory distress. She has no wheezes. She has no rales.  GI: Soft. Bowel sounds are normal. She exhibits no distension. There is no tenderness. There is no rebound and no guarding.  Musculoskeletal: Normal range of motion. She exhibits no edema or tenderness.  Lymphadenopathy:    She has no cervical adenopathy.  Neurological: She is alert and oriented to person, place, and time. She has normal reflexes. She displays normal reflexes. No cranial nerve deficit. She exhibits normal muscle tone. Coordination normal.  Skin: Skin is warm and dry. No rash noted. No erythema. No pallor.  Psychiatric: She has a normal mood and affect. Her behavior is normal. Judgment and thought content normal.     Assessment/Plan Right flank pain secondary to pyelonephritis tx with rocephin 1gm iv qday  Leukocytosis likely secondary to uti/pyelo Check cbc, in am,   Tachycardia, (sinus) Check tsh, d dimer, check cardiac echo  r/o cardiomyopathy Telemetry, check trop Restart on metoprolol,  Pt admitted to being out of medication for about 29month.   Anemia Check cbc in am  Hypertension: Restart on lisinopril and metoprolol  Dm2: fsbs ac and qhs,  Iss,  Start on metformin, lantus    KJani Gravel11/02/2014, 7:54 PM

## 2014-05-15 ENCOUNTER — Observation Stay (HOSPITAL_COMMUNITY): Payer: Medicaid Other

## 2014-05-15 ENCOUNTER — Encounter (HOSPITAL_COMMUNITY): Payer: Self-pay | Admitting: Radiology

## 2014-05-15 DIAGNOSIS — I059 Rheumatic mitral valve disease, unspecified: Secondary | ICD-10-CM

## 2014-05-15 DIAGNOSIS — E872 Acidosis: Secondary | ICD-10-CM | POA: Diagnosis present

## 2014-05-15 DIAGNOSIS — N12 Tubulo-interstitial nephritis, not specified as acute or chronic: Secondary | ICD-10-CM | POA: Diagnosis present

## 2014-05-15 DIAGNOSIS — Z91120 Patient's intentional underdosing of medication regimen due to financial hardship: Secondary | ICD-10-CM | POA: Diagnosis present

## 2014-05-15 DIAGNOSIS — R109 Unspecified abdominal pain: Secondary | ICD-10-CM | POA: Diagnosis not present

## 2014-05-15 DIAGNOSIS — I10 Essential (primary) hypertension: Secondary | ICD-10-CM | POA: Diagnosis present

## 2014-05-15 DIAGNOSIS — R7881 Bacteremia: Secondary | ICD-10-CM | POA: Clinically undetermined

## 2014-05-15 DIAGNOSIS — A4151 Sepsis due to Escherichia coli [E. coli]: Secondary | ICD-10-CM | POA: Diagnosis not present

## 2014-05-15 DIAGNOSIS — A419 Sepsis, unspecified organism: Secondary | ICD-10-CM

## 2014-05-15 DIAGNOSIS — E1165 Type 2 diabetes mellitus with hyperglycemia: Secondary | ICD-10-CM | POA: Diagnosis present

## 2014-05-15 DIAGNOSIS — R000 Tachycardia, unspecified: Secondary | ICD-10-CM

## 2014-05-15 DIAGNOSIS — N920 Excessive and frequent menstruation with regular cycle: Secondary | ICD-10-CM | POA: Diagnosis present

## 2014-05-15 DIAGNOSIS — D500 Iron deficiency anemia secondary to blood loss (chronic): Secondary | ICD-10-CM | POA: Diagnosis present

## 2014-05-15 DIAGNOSIS — Z794 Long term (current) use of insulin: Secondary | ICD-10-CM | POA: Diagnosis not present

## 2014-05-15 LAB — GLUCOSE, CAPILLARY
GLUCOSE-CAPILLARY: 199 mg/dL — AB (ref 70–99)
GLUCOSE-CAPILLARY: 159 mg/dL — AB (ref 70–99)
Glucose-Capillary: 277 mg/dL — ABNORMAL HIGH (ref 70–99)
Glucose-Capillary: 266 mg/dL — ABNORMAL HIGH (ref 70–99)
Glucose-Capillary: 176 mg/dL — ABNORMAL HIGH (ref 70–99)

## 2014-05-15 LAB — CBC WITH DIFFERENTIAL/PLATELET
BASOS ABS: 0 10*3/uL (ref 0.0–0.1)
BASOS PCT: 0 % (ref 0–1)
EOS ABS: 0 10*3/uL (ref 0.0–0.7)
EOS PCT: 0 % (ref 0–5)
HCT: 25.9 % — ABNORMAL LOW (ref 36.0–46.0)
Hemoglobin: 8.3 g/dL — ABNORMAL LOW (ref 12.0–15.0)
LYMPHS PCT: 10 % — AB (ref 12–46)
Lymphs Abs: 1.7 10*3/uL (ref 0.7–4.0)
MCH: 26.8 pg (ref 26.0–34.0)
MCHC: 32 g/dL (ref 30.0–36.0)
MCV: 83.5 fL (ref 78.0–100.0)
MONO ABS: 1.5 10*3/uL — AB (ref 0.1–1.0)
Monocytes Relative: 9 % (ref 3–12)
Neutro Abs: 14.4 10*3/uL — ABNORMAL HIGH (ref 1.7–7.7)
Neutrophils Relative %: 81 % — ABNORMAL HIGH (ref 43–77)
Platelets: 278 10*3/uL (ref 150–400)
RBC: 3.1 MIL/uL — ABNORMAL LOW (ref 3.87–5.11)
RDW: 13.5 % (ref 11.5–15.5)
WBC: 17.6 10*3/uL — ABNORMAL HIGH (ref 4.0–10.5)

## 2014-05-15 LAB — COMPREHENSIVE METABOLIC PANEL
ALT: 20 U/L (ref 0–35)
AST: 38 U/L — AB (ref 0–37)
Albumin: 1.9 g/dL — ABNORMAL LOW (ref 3.5–5.2)
Alkaline Phosphatase: 125 U/L — ABNORMAL HIGH (ref 39–117)
Anion gap: 17 — ABNORMAL HIGH (ref 5–15)
BUN: 9 mg/dL (ref 6–23)
CO2: 18 meq/L — AB (ref 19–32)
CREATININE: 0.79 mg/dL (ref 0.50–1.10)
Calcium: 7.9 mg/dL — ABNORMAL LOW (ref 8.4–10.5)
Chloride: 101 mEq/L (ref 96–112)
GFR calc Af Amer: >90 mL/min (ref >90)
Glucose, Bld: 290 mg/dL — ABNORMAL HIGH (ref 70–99)
Potassium: 4 mEq/L (ref 3.7–5.3)
Sodium: 136 mEq/L — ABNORMAL LOW (ref 137–147)
TOTAL PROTEIN: 6.5 g/dL (ref 6.0–8.3)
Total Bilirubin: 0.4 mg/dL (ref 0.3–1.2)

## 2014-05-15 LAB — T4, FREE: FREE T4: 1.19 ng/dL (ref 0.80–1.80)

## 2014-05-15 LAB — HEMOGLOBIN A1C
Hgb A1c MFr Bld: 16 % — ABNORMAL HIGH (ref <5.7)
MEAN PLASMA GLUCOSE: 413 mg/dL — AB (ref <117)

## 2014-05-15 MED ORDER — METOPROLOL TARTRATE 50 MG PO TABS
50.0000 mg | ORAL_TABLET | Freq: Two times a day (BID) | ORAL | Status: DC
  Administered 2014-05-15 – 2014-05-17 (×4): 50 mg via ORAL
  Filled 2014-05-15 (×5): qty 1

## 2014-05-15 MED ORDER — IOHEXOL 350 MG/ML SOLN
100.0000 mL | Freq: Once | INTRAVENOUS | Status: AC | PRN
  Administered 2014-05-15: 100 mL via INTRAVENOUS

## 2014-05-15 MED ORDER — DOCUSATE SODIUM 100 MG PO CAPS
100.0000 mg | ORAL_CAPSULE | Freq: Two times a day (BID) | ORAL | Status: DC
  Administered 2014-05-15: 100 mg via ORAL
  Filled 2014-05-15 (×3): qty 1

## 2014-05-15 MED ORDER — INSULIN ASPART 100 UNIT/ML ~~LOC~~ SOLN
3.0000 [IU] | Freq: Three times a day (TID) | SUBCUTANEOUS | Status: DC
  Administered 2014-05-15 – 2014-05-17 (×7): 3 [IU] via SUBCUTANEOUS

## 2014-05-15 MED ORDER — LISINOPRIL 20 MG PO TABS
20.0000 mg | ORAL_TABLET | Freq: Every day | ORAL | Status: DC
  Administered 2014-05-16 – 2014-05-17 (×2): 20 mg via ORAL
  Filled 2014-05-15 (×2): qty 1

## 2014-05-15 MED ORDER — DEXTROSE 5 % IV SOLN
2.0000 g | INTRAVENOUS | Status: DC
  Administered 2014-05-15 – 2014-05-16 (×2): 2 g via INTRAVENOUS
  Filled 2014-05-15 (×4): qty 2

## 2014-05-15 MED ORDER — POLYETHYLENE GLYCOL 3350 17 G PO PACK
17.0000 g | PACK | Freq: Every day | ORAL | Status: DC
  Administered 2014-05-15: 17 g via ORAL
  Filled 2014-05-15 (×2): qty 1

## 2014-05-15 MED ORDER — INSULIN ASPART 100 UNIT/ML ~~LOC~~ SOLN
0.0000 [IU] | Freq: Three times a day (TID) | SUBCUTANEOUS | Status: DC
  Administered 2014-05-15 – 2014-05-16 (×3): 3 [IU] via SUBCUTANEOUS
  Administered 2014-05-16: 2 [IU] via SUBCUTANEOUS
  Administered 2014-05-16 – 2014-05-17 (×2): 3 [IU] via SUBCUTANEOUS

## 2014-05-15 NOTE — Progress Notes (Addendum)
PROGRESS NOTE  EVELYNA STEGE [id number] DOB: 11/14/1985 DOA: 05/14/2014 PCP: No PCP Per Patient  HPI: 28 yo female with with complaints of right flank pain with hx of diabetes mellitus type 2.  Denies fever, chills, n/v, diarrhea, constipation, black stool, dysuria, hematuria. Pt found to have UTI/pyelonephritis. Pt noted to be tachycardic in the ED to 150, Sinus tach. Pt denies palpitations.   Subjective: Feeling somewhat better since admission.  Positive nausea and back pain.  Pt notes poor compliance with medications for diabetes and blood pressure due to loss of PCP and cost.    Assessment/Plan: Sepsis due to Pyelonephritis: Some improvement since admission.  WBC trending down 18.2-17.6.  Urine culture shows growth of E coli.  Follow sensitivities.  CT of abd shows pyelonephritis/perinephritic stranding on the right.  Continue IV Rocephin.                Gram-negative Bacteremia:  Known source of infection (pyelonephritis), tachycardic, WBC 17.6.  2 of 2 blood cultures show growth of gram negative rods. Will re-check blood cultures on 11/10 for clearance.  Increase IV Rocephin to 2g. Culture sensitivities pending.    Tachycardia:  due to infection.  TSH and free T4 WNL.  Increase fluids to 125/hour.    Mild Metabolic Acidosis:   CO2: 18. Due to pyelo.  Lactic acid: 1.96.  Continue treatment of pyelonephritis.        Normocytic Anemia: Likely secondary to mennorhagia.  No frank bleeding.  Transfuse if she drops below 7.0 or becomes symptomatic.    Leukocytosis: Likely due to acute infection.  Trending down.  Diabetes mellitus, type 2: Chronic problem.  Poorly controlled- poor compliance with medication due to cost.  A1C pending. Holding metformin as an inpatient.  On novolog and lantus.  Will have case manager speak with pt about finding PCP and medication assistance.      Needs refills at d/c.  HTN:  Stable.  Patient on metoprolol and lisinopril at home, both are ordered with  holding parameters.   DVT Prophylaxis: SCDs  Code Status: Full Family Communication: Pt is awake and alert.   Disposition Plan: Will d/c home when medically appropriate.   Consultants:  None  Procedures:  None  Antibiotics:  IV Rocephin  Objective: Filed Vitals:   05/15/14 0540 05/15/14 0915 05/15/14 0923 05/15/14 1345  BP: 111/72  112/68 95/51  Pulse: 103  140 108  Temp: 98.4 F (36.9 C) 99 F (37.2 C)  99.5 F (37.5 C)  TempSrc: Oral Oral  Oral  Resp: 18   20  Height:      Weight:      SpO2: 100%   99%   No intake or output data in the 24 hours ending 05/15/14 1441 Filed Weights   05/14/14 1528 05/14/14 2118 05/15/14 0500  Weight: 75.297 kg (166 lb) 76.7 kg (169 lb 1.5 oz) 74.4 kg (164 lb 0.4 oz)    Exam: General: NAD but appears uncomfortable, appears stated age  67:  EOMI, Anicteic Sclera, MMM. Cardiovascular: RRR, S1 S2 auscultated, no rubs, murmurs or gallops.   Respiratory: Clear to auscultation bilaterally with equal chest rise  Abdomen: Soft, nontender, nondistended, + bowel sounds, no CVA tenderness  Extremities: warm dry without cyanosis clubbing or edema.  Neuro: AAOx3 Skin: Without rashes exudates or nodules.   Psych: Normal affect and demeanor with intact judgement and insight   Data Reviewed: Basic Metabolic Panel:  Recent Labs Lab 05/14/14 1338 05/14/14 1605 05/15/14 0528  NA 135* 135* 136*  K 3.9 4.4 4.0  CL 100 96 101  CO2  --  21 18*  GLUCOSE 284* 254* 290*  BUN 4* 6 9  CREATININE 0.60 0.56 0.79  CALCIUM  --  8.8 7.9*   Liver Function Tests:  Recent Labs Lab 05/15/14 0528  AST 38*  ALT 20  ALKPHOS 125*  BILITOT 0.4  PROT 6.5  ALBUMIN 1.9*    Recent Labs Lab 05/14/14 1605  LIPASE 7*   CBC:  Recent Labs Lab 05/14/14 1323 05/14/14 1338 05/14/14 1605 05/15/14 0528  WBC 19.8*  --  18.2* 17.6*  NEUTROABS 17.0*  --  15.6* 14.4*  HGB 10.1* 11.6* 9.8* 8.3*  HCT 31.2* 34.0* 31.1* 25.9*  MCV 81.9  --   82.5 83.5  PLT 342  --  305 278   Cardiac Enzymes:  Recent Labs Lab 05/14/14 2147  TROPONINI <0.30   CBG:  Recent Labs Lab 05/14/14 2204 05/15/14 0803 05/15/14 1226  GLUCAP 277* 266* 176*    Recent Results (from the past 240 hour(s))  Urine culture     Status: None (Preliminary result)   Collection Time: 05/14/14  1:31 PM  Result Value Ref Range Status   Specimen Description URINE, CLEAN CATCH  Final   Special Requests NONE  Final   Culture  Setup Time   Final    05/14/2014 23:06 Performed at [facility]   Final    >=100,000 COLONIES/ML Performed at [facility]    Culture   Final    ESCHERICHIA COLI Performed at [facility]    Report Status PENDING  Incomplete  Blood culture (routine x 2)     Status: None (Preliminary result)   Collection Time: 05/14/14  4:27 PM  Result Value Ref Range Status   Specimen Description BLOOD RIGHT ANTECUBITAL  Final   Special Requests BOTTLES DRAWN AEROBIC AND ANAEROBIC 10CC EA  Final   Culture  Setup Time   Final    05/15/2014 00:32 Performed at [facility]    Culture   Final    GRAM NEGATIVE RODS Note: Gram Stain Report Called to,Read Back By and Verified With: THERNA JEDER 05/15/14 1122 BY SMITHERSJ Performed at [facility]    Report Status PENDING  Incomplete  Blood culture (routine x 2)     Status: None (Preliminary result)   Collection Time: 05/14/14  4:42 PM  Result Value Ref Range Status   Specimen Description BLOOD RIGHT HAND  Final   Special Requests BOTTLES DRAWN AEROBIC AND ANAEROBIC 5CC EA  Final   Culture  Setup Time   Final    05/15/2014 00:32 Performed at [facility]    Culture   Final    GRAM NEGATIVE RODS Note: Gram Stain Report Called to,Read Back By and Verified With: THERNA JEDER 05/15/14 1125 BY SMITHERSJ Performed at [facility]    Report Status PENDING  Incomplete     Studies: Ct Abdomen Pelvis Wo  Contrast  05/14/2014   CLINICAL DATA:  Rt flank pain radiates to rlq abd pain that started today. Pt states there was blood in her urine.  EXAM: CT ABDOMEN AND PELVIS WITHOUT CONTRAST  TECHNIQUE: Multidetector CT imaging of the abdomen and pelvis was performed following the standard protocol without IV contrast.  COMPARISON:  None.  FINDINGS: There are no renal or ureteral stones.  There is no hydronephrosis.  On the right, there is perinephric  stranding. No perinephric fluid collection.  No renal masses.  Bladder is unremarkable.  Clear lung bases.  Heart is normal in size.  Normal liver, spleen, gallbladder and pancreas. No bile duct dilation. No adrenal masses.  Uterus contains a well-positioned IUD. No uterine masses. No adnexal masses.  No pathologically enlarged lymph nodes. No abnormal fluid collections.  Normal bowel.  Normal appendix.  No bony abnormality.  IMPRESSION: 1. Right perinephric stranding. No renal or ureteral stones or obstructive uropathy. Findings are consistent with right pyelonephritis. No perinephric abscess. 2. No other abnormalities.   Electronically Signed   By: Lajean Manes M.D.   On: 05/14/2014 18:50   Ct Angio Chest Pe W/cm &/or Wo Cm  05/15/2014   CLINICAL DATA:  Tachycardia.  EXAM: CT ANGIOGRAPHY CHEST WITH CONTRAST  TECHNIQUE: Multidetector CT imaging of the chest was performed using the standard protocol during bolus administration of intravenous contrast. Multiplanar CT image reconstructions and MIPs were obtained to evaluate the vascular anatomy.  CONTRAST:  100 mL OMNIPAQUE IOHEXOL 350 MG/ML SOLN  COMPARISON:  PA and lateral chest 01/04/2014.  FINDINGS: No pulmonary embolus is identified. Heart size is normal. No pleural or pericardial effusion. No axillary, hilar or mediastinal lymphadenopathy is seen. The lungs demonstrate only mild dependent atelectasis. Incidentally imaged upper abdomen shows no focal abnormality. No focal bony abnormality is seen.  Review of the MIP  images confirms the above findings.  IMPRESSION: Negative for pulmonary embolus.  Negative examination.   Electronically Signed   By: Inge Rise M.D.   On: 05/15/2014 02:53    Scheduled Meds: . cefTRIAXone (ROCEPHIN)  IV  2 g Intravenous Q24H  . docusate sodium  100 mg Oral BID  . enoxaparin (LOVENOX) injection  40 mg Subcutaneous Q24H  . insulin aspart  0-15 Units Subcutaneous TID WC  . insulin aspart  0-5 Units Subcutaneous QHS  . insulin aspart  3 Units Subcutaneous TID WC  . insulin glargine  15 Units Subcutaneous QHS  . [START ON 05/16/2014] lisinopril  20 mg Oral Daily  . metoprolol  50 mg Oral BID  . polyethylene glycol  17 g Oral Daily  . sodium chloride  3 mL Intravenous Q12H   Continuous Infusions: . sodium chloride 125 mL/hr at 05/15/14 1002    Principal Problem:   Tachycardia Active Problems:   Leukocytosis   Right flank pain   Anemia   Diabetes   Bacteremia   Pyelonephritis    Adelene Idler PA-S  Imogene Burn, PA-C [facility] Pager [phone]. If 7PM-7AM, please contact night-coverage at [email or web address], password [facility] 05/15/2014, 2:41 PM  LOS: 1 day       Patient seen and examined, chart and data base reviewed.  I agree with the above assessment and plan.  For full details please see Mrs. Imogene Burn PA note.  Patient admitted last night with sepsis of urinary origin. This morning her blood cultures speciated gram-negative rods. She is on IV ceftriaxone, seems to be doing well, continue these antibiotics for now. We'll probably need surveillance culture sometimes later tomorrow. She is to be on IV antibiotics until she is afebrile for 24 hours and ideally have speciation on her gram-negative bacteremia. She has a history of chronic sinus tachycardia with her average heart rate into the 1 teens (per patient). Her TSH is normal.  Marzetta Board, MD [facility] [phone]

## 2014-05-15 NOTE — Progress Notes (Signed)
UR completed 

## 2014-05-15 NOTE — Progress Notes (Signed)
Echocardiogram 2D Echocardiogram has been performed.  Jane Santiago 05/15/2014, 10:07 AM

## 2014-05-15 NOTE — Progress Notes (Signed)
CARE MANAGEMENT NOTE 05/15/2014  Patient:  Jane Santiago,Jane Santiago   Account Number:  [id number]  Date Initiated:  05/15/2014  Documentation initiated by:  [facility]  Subjective/Objective Assessment:   Pyelonephritis     Action/Plan:   Anticipated DC Date:  05/18/2014   Anticipated DC Plan:  [place]  CM consult  PCP issues      Choice offered to / List presented to:             Status of service:  Completed, signed off Medicare Important Message given?   (If response is "NO", the following Medicare IM given date fields will be blank) Date Medicare IM given:   Medicare IM given by:   Date Additional Medicare IM given:   Additional Medicare IM given by:    Discharge Disposition:  HOME/SELF CARE  Per UR Regulation:    If discussed at Long Length of Stay Meetings, dates discussed:    Comments:  05/15/2014 1530 NCM spoke to pt and states she has a list of PCP that will accept Medicaid given to her by DSS CW but she has not established a new PCP. Explained NCM will arrange an appt with [facility] for hospital follow up appt. Will call on day of dc for appt at [facility]. She can pick up her medications from clinic at dc. Jonnie Finner RN CCM Case Mgmt phone [phone]

## 2014-05-15 NOTE — Progress Notes (Signed)
Inpatient Diabetes Program Recommendations  AACE/ADA: New Consensus Statement on Inpatient Glycemic Control (2013)  Target Ranges:  Prepandial:   less than 140 mg/dL      Peak postprandial:   less than 180 mg/dL (1-2 hours)      Critically ill patients:  140 - 180 mg/dL   CBG's running in 200's and 300's. Noted increase in correction to moderate.  Inpatient Diabetes Program Recommendations Insulin - Basal: Please check HgbA1C for potential home insulin needs.  Thank you, Rosita Kea, RN, CNS, Diabetes Coordinator [phone])

## 2014-05-15 NOTE — Plan of Care (Signed)
Problem: Phase I Progression Outcomes Goal: Pain controlled with appropriate interventions Outcome: Completed/Met Date Met:  05/15/14     

## 2014-05-15 NOTE — Progress Notes (Signed)
CRITICAL VALUE ALERT  Critical value received:  Positive Blood Cultures  Date of notification:  05/15/14  Time of notification:  11:25am  Critical value read back:Yes.    Nurse who received alert:  Marguerite Olea  MD notified (1st page):  Gherghe  Time of first page:  11:30 am Verbal face to face encounter  MD notified (2nd page):  Time of second page:  Responding MD:    Time MD responded:

## 2014-05-15 NOTE — Plan of Care (Signed)
Problem: Phase I Progression Outcomes Goal: OOB as tolerated unless otherwise ordered Outcome: Completed/Met Date Met:  05/15/14

## 2014-05-16 DIAGNOSIS — N12 Tubulo-interstitial nephritis, not specified as acute or chronic: Secondary | ICD-10-CM

## 2014-05-16 LAB — URINE CULTURE: Colony Count: >=100000

## 2014-05-16 LAB — GLUCOSE, CAPILLARY
GLUCOSE-CAPILLARY: 150 mg/dL — AB (ref 70–99)
GLUCOSE-CAPILLARY: 122 mg/dL — AB (ref 70–99)
Glucose-Capillary: 151 mg/dL — ABNORMAL HIGH (ref 70–99)
Glucose-Capillary: 180 mg/dL — ABNORMAL HIGH (ref 70–99)

## 2014-05-16 MED ORDER — INSULIN GLARGINE 100 UNIT/ML ~~LOC~~ SOLN
18.0000 [IU] | Freq: Every day | SUBCUTANEOUS | Status: DC
  Administered 2014-05-16: 18 [IU] via SUBCUTANEOUS
  Filled 2014-05-16 (×3): qty 0.18

## 2014-05-16 NOTE — Plan of Care (Signed)
Problem: Phase I Progression Outcomes Goal: Initial discharge plan identified Outcome: Completed/Met Date Met:  05/16/14 Goal: Voiding-avoid urinary catheter unless indicated Outcome: Completed/Met Date Met:  05/16/14 Goal: Hemodynamically stable Outcome: Progressing Goal: Other Phase I Outcomes/Goals Outcome: Not Applicable Date Met:  35/39/12

## 2014-05-16 NOTE — Progress Notes (Addendum)
PROGRESS NOTE  MAKELL LACEWELL [id number] DOB: [birth year] DOA: 05/14/2014 PCP: No PCP Per Patient  HPI: 28 yo female with with complaints of right flank pain with hx of diabetes mellitus type 2.  Denies fever, chills, n/v, diarrhea, constipation, black stool, dysuria, hematuria. Pt found to have UTI/pyelonephritis. Pt noted to be tachycardic in the ED to 150, Sinus tach. Pt denies palpitations.   Subjective: Improved since yesterday.  Denies nausea, fever, chills, sweats, back pain, and dysuria.    Assessment/Plan: Sepsis due to Pyelonephritis: Improving.  WBC trending down 18.2-17.6.  Urine culture shows growth of E coli.  Follow sensitivities.  CT of abd shows pyelonephritis/perinephritic stranding on the right.  Continue IV Rocephin 2g.  Will transition to PO antibiotics once sensitivities are available.                  Gram-negative Bacteremia:  Known source of infection (pyelonephritis), tachycardic, WBC 17.6.  2 of 2 blood cultures show growth of gram negative rods. Blood cultures for clearance pending.  Increase IV Rocephin to 2g. Culture sensitivities pending.  Will transition to PO antibiotics once sensitivities are available.    Tachycardia:  Due to infection.  TSH and free T4 WNL.    Mild Metabolic Acidosis: Labs from 11/09 show: CO2: 18. Due to pyelo.  Lactic acid: 1.96.  Continue treatment of pyelonephritis.        Normocytic Anemia: Likely secondary to mennorhagia.  No frank bleeding.  Transfuse if she drops below 7.0 or becomes symptomatic.    Leukocytosis: Likely due to acute infection.  Trending down with last labs on 11/09.  Diabetes mellitus, type 2: Chronic problem.  Poorly controlled- poor compliance with medication due to cost.  A1C 16.0.  Holding metformin as an inpatient.  On novolog and lantus.  Have increased lantus to 18 units (11/10).  Will have case manager speak with pt about finding PCP and medication assistance.  Needs refills at d/c.  HTN:  Stable.   Patient on metoprolol and lisinopril at home, both are ordered with holding parameters.   DVT Prophylaxis: SCDs  Code Status: Full Family Communication: Pt is awake and alert.   Disposition Plan: Will d/c home when medically appropriate.   Consultants:  None  Procedures:  None  Antibiotics:  IV Rocephin  Objective: Filed Vitals:   05/15/14 2155 05/16/14 0600 05/16/14 1044 05/16/14 1312  BP: 106/52 115/67 118/70 122/75  Pulse: 116 104 110 106  Temp: 98.9 F (37.2 C) 98.7 F (37.1 C)  99.8 F (37.7 C)  TempSrc: Oral Oral  Oral  Resp: 18 20  16   Height:      Weight:  78.4 kg (172 lb 13.5 oz)    SpO2: 100% 100%  100%    Intake/Output Summary (Last 24 hours) at 05/16/14 1406 Last data filed at 05/16/14 0900  Gross per 24 hour  Intake    720 ml  Output      0 ml  Net    720 ml   Filed Weights   05/14/14 2118 05/15/14 0500 05/16/14 0600  Weight: 76.7 kg (169 lb 1.5 oz) 74.4 kg (164 lb 0.4 oz) 78.4 kg (172 lb 13.5 oz)    Exam: General: NAD, appears stated age  33:  EOMI, Anicteic Sclera, MMM. Cardiovascular: Tachycardiac with regular rhythm, S1 S2 auscultated, no rubs, murmurs or gallops.   Respiratory: Clear to auscultation bilaterally with equal chest rise  Abdomen: Soft, nontender, nondistended, + bowel sounds, no CVA tenderness  Extremities: warm dry without cyanosis clubbing or edema.  Neuro: AAOx3 Skin: Without rashes exudates or nodules.   Psych: Normal affect and demeanor with intact judgement and insight   Data Reviewed: Basic Metabolic Panel:  Recent Labs Lab 05/14/14 1338 05/14/14 1605 05/15/14 0528  NA 135* 135* 136*  K 3.9 4.4 4.0  CL 100 96 101  CO2  --  21 18*  GLUCOSE 284* 254* 290*  BUN 4* 6 9  CREATININE 0.60 0.56 0.79  CALCIUM  --  8.8 7.9*   Liver Function Tests:  Recent Labs Lab 05/15/14 0528  AST 38*  ALT 20  ALKPHOS 125*  BILITOT 0.4  PROT 6.5  ALBUMIN 1.9*    Recent Labs Lab 05/14/14 1605  LIPASE 7*    CBC:  Recent Labs Lab 05/14/14 1323 05/14/14 1338 05/14/14 1605 05/15/14 0528  WBC 19.8*  --  18.2* 17.6*  NEUTROABS 17.0*  --  15.6* 14.4*  HGB 10.1* 11.6* 9.8* 8.3*  HCT 31.2* 34.0* 31.1* 25.9*  MCV 81.9  --  82.5 83.5  PLT 342  --  305 278   Cardiac Enzymes:  Recent Labs Lab 05/14/14 2147  TROPONINI <0.30   CBG:  Recent Labs Lab 05/15/14 1226 05/15/14 1639 05/15/14 2231 05/16/14 0810 05/16/14 1205  GLUCAP 176* 199* 159* 151* 180*    Recent Results (from the past 240 hour(s))  Urine culture     Status: None (Preliminary result)   Collection Time: 05/14/14  1:31 PM  Result Value Ref Range Status   Specimen Description URINE, CLEAN CATCH  Final   Special Requests NONE  Final   Culture  Setup Time   Final    05/14/2014 23:06 Performed at [place]   Final    >=100,000 COLONIES/ML Performed at [facility]    Culture   Final    ESCHERICHIA COLI Performed at [facility]    Report Status PENDING  Incomplete  Blood culture (routine x 2)     Status: None (Preliminary result)   Collection Time: 05/14/14  4:27 PM  Result Value Ref Range Status   Specimen Description BLOOD RIGHT ANTECUBITAL  Final   Special Requests BOTTLES DRAWN AEROBIC AND ANAEROBIC 10CC EA  Final   Culture  Setup Time   Final    05/15/2014 00:32 Performed at [facility]    Culture   Final    ESCHERICHIA COLI Note: Gram Stain Report Called to,Read Back By and Verified With: THERNA JEDER 05/15/14 1122 BY SMITHERSJ Performed at [facility]    Report Status PENDING  Incomplete  Blood culture (routine x 2)     Status: None (Preliminary result)   Collection Time: 05/14/14  4:42 PM  Result Value Ref Range Status   Specimen Description BLOOD RIGHT HAND  Final   Special Requests BOTTLES DRAWN AEROBIC AND ANAEROBIC 5CC EA  Final   Culture  Setup Time   Final    05/15/2014 00:32 Performed at [facility]    Culture    Final    ESCHERICHIA COLI Note: Gram Stain Report Called to,Read Back By and Verified With: THERNA JEDER 05/15/14 1125 BY SMITHERSJ Performed at [facility]    Report Status PENDING  Incomplete     Studies: Ct Abdomen Pelvis Wo Contrast: 05/14/2014 IMPRESSION: 1. Right perinephric stranding. No renal or ureteral stones or obstructive uropathy. Findings are consistent with right pyelonephritis. No perinephric abscess. 2. No other abnormalities.  Ct Angio Chest Pe W/cm &/or Wo Cm: 05/15/2014   IMPRESSION: Negative for pulmonary embolus.  Negative examination.     Scheduled Meds: . cefTRIAXone (ROCEPHIN)  IV  2 g Intravenous Q24H  . enoxaparin (LOVENOX) injection  40 mg Subcutaneous Q24H  . insulin aspart  0-15 Units Subcutaneous TID WC  . insulin aspart  0-5 Units Subcutaneous QHS  . insulin aspart  3 Units Subcutaneous TID WC  . insulin glargine  18 Units Subcutaneous QHS  . lisinopril  20 mg Oral Daily  . metoprolol  50 mg Oral BID  . sodium chloride  3 mL Intravenous Q12H   Continuous Infusions:    Principal Problem:   Tachycardia Active Problems:   Leukocytosis   Right flank pain   Anemia   Diabetes   Bacteremia   Pyelonephritis   Adelene Idler PA-S  Imogene Burn, PA-C [facility] Pager [phone]. If 7PM-7AM, please contact night-coverage at [email or web address], password [facility] 05/16/2014, 2:06 PM  LOS: 2 days       Patient seen and examined, chart and data base reviewed.  I agree with the above assessment and plan.  For full details please see Mrs. Imogene Burn PA note.  Patient is clinically much improved today, she is currently asymptomatic and feeling back to baseline. Her blood cultures did show gram-negative bacteremia, will keep patient hospitalized and on IV antibiotics until speciation is available. She can then be probably transition to oral antibiotics to complete an outpatient course. Her sinus tachycardia is back to baseline without a  clear etiology. She is asymptomatic from this.  Marzetta Board, MD [facility] [phone]

## 2014-05-16 NOTE — ED Notes (Signed)
Urine culture: >100,000 colonies E. Coli-pending.  Pt. was transferred to ED and treated with Rocephin 2 gm IV and admitted to the hospital. Roselyn Meier 05/16/2014

## 2014-05-17 DIAGNOSIS — A419 Sepsis, unspecified organism: Secondary | ICD-10-CM

## 2014-05-17 DIAGNOSIS — A4151 Sepsis due to Escherichia coli [E. coli]: Principal | ICD-10-CM

## 2014-05-17 LAB — CULTURE, BLOOD (ROUTINE X 2)

## 2014-05-17 LAB — GLUCOSE, CAPILLARY
GLUCOSE-CAPILLARY: 116 mg/dL — AB (ref 70–99)
Glucose-Capillary: 194 mg/dL — ABNORMAL HIGH (ref 70–99)

## 2014-05-17 MED ORDER — METFORMIN HCL 1000 MG PO TABS
1000.0000 mg | ORAL_TABLET | Freq: Two times a day (BID) | ORAL | Status: DC
Start: 2014-05-17 — End: 2015-05-21

## 2014-05-17 MED ORDER — CIPROFLOXACIN HCL 500 MG PO TABS: 500.0000 mg | ORAL_TABLET | Freq: Two times a day (BID) | ORAL | Status: DC

## 2014-05-17 MED ORDER — METOPROLOL TARTRATE 50 MG PO TABS: 50.0000 mg | ORAL_TABLET | Freq: Every day | ORAL | Status: DC

## 2014-05-17 MED ORDER — CIPROFLOXACIN HCL 750 MG PO TABS
750.0000 mg | ORAL_TABLET | Freq: Two times a day (BID) | ORAL | Status: DC
  Administered 2014-05-17: 750 mg via ORAL
  Filled 2014-05-17 (×3): qty 1

## 2014-05-17 MED ORDER — LISINOPRIL 20 MG PO TABS: 20.0000 mg | ORAL_TABLET | Freq: Every day | ORAL | Status: DC

## 2014-05-17 MED ORDER — CIPROFLOXACIN HCL 750 MG PO TABS
750.0000 mg | ORAL_TABLET | Freq: Two times a day (BID) | ORAL | Status: DC
Start: 2014-05-17 — End: 2014-05-17

## 2014-05-17 MED ORDER — INSULIN LISPRO 100 UNIT/ML ~~LOC~~ SOLN: 5.0000 [IU] | Freq: Every day | SUBCUTANEOUS | Status: DC | PRN

## 2014-05-17 MED ORDER — INSULIN GLARGINE 100 UNIT/ML ~~LOC~~ SOLN: 18.0000 [IU] | Freq: Every day | SUBCUTANEOUS | Status: DC

## 2014-05-17 NOTE — Progress Notes (Signed)
Nsg Discharge Note  Admit Date:  05/14/2014 Discharge date: 05/17/2014   MELYNN KNEALE to be D/C'd Home per MD order.  AVS completed.  Copy for chart, and copy for patient signed, and dated. Patient/caregiver able to verbalize understanding.  Discharge Medication:   Medication List    STOP taking these medications        cyclobenzaprine 10 MG tablet  Commonly known as:  FLEXERIL     methocarbamol 750 MG tablet  Commonly known as:  ROBAXIN     naproxen 375 MG tablet  Commonly known as:  NAPROSYN     naproxen 500 MG tablet  Commonly known as:  NAPROSYN      TAKE these medications        ciprofloxacin 500 MG tablet  Commonly known as:  CIPRO  Take 1 tablet (500 mg total) by mouth 2 (two) times daily.     insulin glargine 100 UNIT/ML injection  Commonly known as:  LANTUS  Inject 0.18 mLs (18 Units total) into the skin at bedtime.     insulin lispro 100 UNIT/ML injection  Commonly known as:  HUMALOG  Inject 0.05 mLs (5 Units total) into the skin daily as needed.     lisinopril 20 MG tablet  Commonly known as:  PRINIVIL,ZESTRIL  Take 1 tablet (20 mg total) by mouth daily.     metFORMIN 1000 MG tablet  Commonly known as:  GLUCOPHAGE  Take 1 tablet (1,000 mg total) by mouth 2 (two) times daily with a meal.     metoprolol 50 MG tablet  Commonly known as:  LOPRESSOR  Take 1 tablet (50 mg total) by mouth daily.        Discharge Assessment: Filed Vitals:   05/17/14 1014  BP: 143/77  Pulse: 108  Temp:   Resp:    Skin clean, dry and intact without evidence of skin break down, no evidence of skin tears noted. IV catheter discontinued intact. Site without signs and symptoms of complications - no redness or edema noted at insertion site, patient denies c/o pain - only slight tenderness at site.  Dressing with slight pressure applied.  D/c Instructions-Education: Discharge instructions given to patient/family with verbalized understanding. D/c education completed with  patient/family including follow up instructions, medication list, d/c activities limitations if indicated, with other d/c instructions as indicated by MD - patient able to verbalize understanding, all questions fully answered. Patient instructed to return to ED, call 911, or call MD for any changes in condition.  Patient escorted via [place], and D/C home via private auto.  Dayle Points, RN 05/17/2014 12:49 PM

## 2014-05-17 NOTE — Progress Notes (Signed)
CARE MANAGEMENT NOTE 05/17/2014  Patient:  Dollins,Kenni R   Account Number:  [id number]  Date Initiated:  05/15/2014  Documentation initiated by:  [facility]  Subjective/Objective Assessment:   Pyelonephritis     Action/Plan:   Anticipated DC Date:  05/18/2014   Anticipated DC Plan:  [place]  CM consult  PCP issues      Choice offered to / List presented to:             Status of service:  Completed, signed off Medicare Important Message given?   (If response is "NO", the following Medicare IM given date fields will be blank) Date Medicare IM given:   Medicare IM given by:   Date Additional Medicare IM given:   Additional Medicare IM given by:    Discharge Disposition:  HOME/SELF CARE  Per UR Regulation:    If discussed at Long Length of Stay Meetings, dates discussed:    Comments:  05/17/2014 1239 Appt arranged for May 19, 2014 at 9 am on Friday at [facility]. Spoke to pt and states time works for her schedule. Jonnie Finner RN CCM Case Mgmt phone [phone]  05/15/2014 1530 NCM spoke to pt and states she has a list of PCP that will accept Medicaid given to her by DSS CW but she has not established a new PCP. Explained NCM will arrange an appt with [facility] for hospital follow up appt. Will call on day of dc for appt at [facility]. She can pick up her medications from clinic at dc. Jonnie Finner RN CCM Case Mgmt phone [phone]

## 2014-05-17 NOTE — Discharge Summary (Signed)
Physician Discharge Summary  Jane Santiago [id number] DOB: 1986-01-28 DOA: 05/14/2014  PCP: No PCP Per Patient  Admit date: 05/14/2014 Discharge date: 05/17/2014  Time spent: 45 minutes  Recommendations for Outpatient Follow-up:  1. Follow finalized blood cultures from 11/10. 2. Outpatient work up for anemia 3. CMET, CBC in 1-2 weeks to monitor wbc, albumin.  Discharge Diagnoses:  Principal Problem:   Tachycardia Active Problems:   Leukocytosis   Right flank pain   Anemia   Diabetes   Bacteremia   Pyelonephritis   Discharge Condition: stable.  Diet recommendation: carb modified.   History of present illness:  28 yo female with with complaints of right flank pain with hx of diabetes mellitus type 2.She has been off of medications for several months due to financial difficulties.   Pt found to have UTI/pyelonephritis. Pt noted to be tachycardic in the ED to 150, Sinus tach. Pt denies palpitations.   Hospital Course:  Sepsis due to Bacteremia/Pyelonephritis: Much improvement since admission.  No further back pain, eating well. Blood and Urine cultures show growth of E coli sensitive to Rocephin and Cipro.   CT of abd shows pyelonephritis/perinephritic stranding on the right. She received 3 doses of IV Rocephin inpatient and will be discharged on 13 more days of oral Cipro.  Patient has been strongly advised to follow up with either the Health Dept or [facility] post discharge.   Gram-negative Bacteremia: Known source of infection (pyelonephritis), 2 of 2 blood cultures show growth of ecoli sensitive to Cipro and Rocephin.  Clearance cultures were drawn on 11/10 and show no growth to date. Patient will be discharged to home on an additional 13 days of Cipro.   Tachycardia: Patient reports that her resting heart rate is 110.  She was having heart rates in the 150s on admission due to infection.  TSH is WNL (0.9). Patient had an elevated  D-dimer and subsequently a  CTA chest.  The test was negative for PE or lung abnormality.  Pulse rate is back to baseline.  Normocytic Anemia: Likely secondary to mennorhagia.Hgb 8.3 on 11/9.   No frank bleeding. Stable for outpatient work up.  Leukocytosis: Likely due to acute infection. Trending down.    Diabetes mellitus, type 2:  Poorly controlled.  Poor compliance with medication due to cost. A1C 16.0. Metformin was held as an inpatient. She will resume metformin, humalog and lantus outpatient.. Case management meet with Jane Santiago on 11/10 and recommended that she call the [facility] on the morning of D/C to schedule an appointment and then to go to the [facility] for her medications.  HTN: Stable. Patient on metoprolol and lisinopril at home.  She will be continued on these medications.   Procedures:  none  Consultations:  None  Discharge Exam:  Filed Weights   05/15/14 0500 05/16/14 0600 05/17/14 0644  Weight: 74.4 kg (164 lb 0.4 oz) 78.4 kg (172 lb 13.5 oz) 77.9 kg (171 lb 11.8 oz)    Filed Vitals:   05/16/14 2146 05/17/14 0642 05/17/14 0644 05/17/14 1014  BP: 104/64 123/83  143/77  Pulse: 104 97  108  Temp: 100 F (37.8 C) 99.2 F (37.3 C)    TempSrc: Oral Oral    Resp: 17 13    Height:      Weight:   77.9 kg (171 lb 11.8 oz)   SpO2: 100% 100%     General:  Wd, Wn, 28 yo female who  is well appearing.  Family at bedside HEENT:  PEERL, EOMI, no lymphadenopathy CV:  Tachycardic but improved.  No M/R/G.  No lower ext edema. Resp:  CTA, no accessory muscle movement Abdomen:  Soft, nt, nd, +bs, no masses, no CVA tenderness Extremities:  5/5 strength in each.  Discharge Instructions   Discharge Instructions    Diet - low sodium heart healthy    Complete by:  As directed      Increase activity slowly    Complete by:  As directed           Current Discharge Medication List    START taking these medications   Details   ciprofloxacin (CIPRO) 500 MG tablet Take 1 tablet (500 mg total) by mouth 2 (two) times daily. Qty: 26 tablet, Refills: 0      CONTINUE these medications which have CHANGED   Details  insulin glargine (LANTUS) 100 UNIT/ML injection Inject 0.18 mLs (18 Units total) into the skin at bedtime. Qty: 10 mL, Refills: 11    insulin lispro (HUMALOG) 100 UNIT/ML injection Inject 0.05 mLs (5 Units total) into the skin daily as needed. Qty: 10 mL, Refills: 11    lisinopril (PRINIVIL,ZESTRIL) 20 MG tablet Take 1 tablet (20 mg total) by mouth daily. Qty: 30 tablet, Refills: 6    metFORMIN (GLUCOPHAGE) 1000 MG tablet Take 1 tablet (1,000 mg total) by mouth 2 (two) times daily with a meal. Qty: 60 tablet, Refills: 6    metoprolol (LOPRESSOR) 50 MG tablet Take 1 tablet (50 mg total) by mouth daily. Qty: 30 tablet, Refills: 6      STOP taking these medications     cyclobenzaprine (FLEXERIL) 10 MG tablet      naproxen (NAPROSYN) 375 MG tablet      methocarbamol (ROBAXIN) 750 MG tablet      naproxen (NAPROSYN) 500 MG tablet        No Known Allergies Follow-up Information    [facility].   Follow up In 1 week.   Why:  Please see the health department in 1-2 weeks for blood work to follow your anemia and bacteremia       [place]    .   Why:  This is the [facility] - if it is a better option for you.     Contact information:   [address] [place] [phone]       The results of significant diagnostics from this hospitalization (including imaging, microbiology, ancillary and laboratory) are listed below for reference.    Significant Diagnostic Studies: Ct Abdomen Pelvis Wo Contrast  05/14/2014   CLINICAL DATA:  Rt flank pain radiates to rlq abd pain that started today. Pt states there was blood in her urine.  EXAM: CT ABDOMEN AND PELVIS WITHOUT CONTRAST  TECHNIQUE:  Multidetector CT imaging of the abdomen and pelvis was performed following the standard protocol without IV contrast.  COMPARISON:  None.  FINDINGS: There are no renal or ureteral stones.  There is no hydronephrosis.  On the right, there is perinephric stranding. No perinephric fluid collection.  No renal masses.  Bladder is unremarkable.  Clear lung bases.  Heart is normal in size.  Normal liver, spleen, gallbladder and pancreas. No bile duct dilation. No adrenal masses.  Uterus contains a well-positioned IUD. No uterine masses. No adnexal masses.  No pathologically enlarged lymph nodes. No abnormal fluid collections.  Normal bowel.  Normal  appendix.  No bony abnormality.  IMPRESSION: 1. Right perinephric stranding. No renal or ureteral stones or obstructive uropathy. Findings are consistent with right pyelonephritis. No perinephric abscess. 2. No other abnormalities.   Electronically Signed   By: Lajean Manes M.D.   On: 05/14/2014 18:50   Ct Angio Chest Pe W/cm &/or Wo Cm  05/15/2014   CLINICAL DATA:  Tachycardia.  EXAM: CT ANGIOGRAPHY CHEST WITH CONTRAST  TECHNIQUE: Multidetector CT imaging of the chest was performed using the standard protocol during bolus administration of intravenous contrast. Multiplanar CT image reconstructions and MIPs were obtained to evaluate the vascular anatomy.  CONTRAST:  100 mL OMNIPAQUE IOHEXOL 350 MG/ML SOLN  COMPARISON:  PA and lateral chest 01/04/2014.  FINDINGS: No pulmonary embolus is identified. Heart size is normal. No pleural or pericardial effusion. No axillary, hilar or mediastinal lymphadenopathy is seen. The lungs demonstrate only mild dependent atelectasis. Incidentally imaged upper abdomen shows no focal abnormality. No focal bony abnormality is seen.  Review of the MIP images confirms the above findings.  IMPRESSION: Negative for pulmonary embolus.  Negative examination.   Electronically Signed   By: Inge Rise M.D.   On: 05/15/2014 02:53     Microbiology: Recent Results (from the past 240 hour(s))  Urine culture     Status: None   Collection Time: 05/14/14  1:31 PM  Result Value Ref Range Status   Specimen Description URINE, CLEAN CATCH  Final   Special Requests NONE  Final   Culture  Setup Time   Final    05/14/2014 23:06 Performed at [facility]   Final    >=100,000 COLONIES/ML Performed at [facility]    Culture   Final    ESCHERICHIA COLI Performed at [facility]    Report Status 05/16/2014 FINAL  Final   Organism ID, Bacteria ESCHERICHIA COLI  Final      Susceptibility   Escherichia coli - MIC*    AMPICILLIN >=32 RESISTANT Resistant     CEFAZOLIN <=4 SENSITIVE Sensitive     CEFTRIAXONE <=1 SENSITIVE Sensitive     CIPROFLOXACIN <=0.25 SENSITIVE Sensitive     GENTAMICIN >=16 RESISTANT Resistant     LEVOFLOXACIN 0.5 SENSITIVE Sensitive     NITROFURANTOIN <=16 SENSITIVE Sensitive     TOBRAMYCIN 8 INTERMEDIATE Intermediate     TRIMETH/SULFA >=320 RESISTANT Resistant     PIP/TAZO <=4 SENSITIVE Sensitive     * ESCHERICHIA COLI  Blood culture (routine x 2)     Status: None   Collection Time: 05/14/14  4:27 PM  Result Value Ref Range Status   Specimen Description BLOOD RIGHT ANTECUBITAL  Final   Special Requests BOTTLES DRAWN AEROBIC AND ANAEROBIC 10CC EA  Final   Culture  Setup Time   Final    05/15/2014 00:32 Performed at [facility]    Culture   Final    ESCHERICHIA COLI Note: Gram Stain Report Called to,Read Back By and Verified With: THERNA JEDER 05/15/14 1122 BY SMITHERSJ Performed at [facility]    Report Status 05/17/2014 FINAL  Final   Organism ID, Bacteria ESCHERICHIA COLI  Final      Susceptibility   Escherichia coli - MIC*    AMPICILLIN >=32 RESISTANT Resistant     AMPICILLIN/SULBACTAM 4 SENSITIVE Sensitive     CEFAZOLIN <=4 SENSITIVE Sensitive     CEFEPIME <=1 SENSITIVE Sensitive     CEFTAZIDIME <=1 SENSITIVE Sensitive  CEFTRIAXONE <=1 SENSITIVE Sensitive     CIPROFLOXACIN <=0.25 SENSITIVE Sensitive     GENTAMICIN >=16 RESISTANT Resistant     IMIPENEM <=0.25 SENSITIVE Sensitive     PIP/TAZO <=4 SENSITIVE Sensitive     TOBRAMYCIN 2 SENSITIVE Sensitive     TRIMETH/SULFA >=320 RESISTANT Resistant     * ESCHERICHIA COLI  Blood culture (routine x 2)     Status: None   Collection Time: 05/14/14  4:42 PM  Result Value Ref Range Status   Specimen Description BLOOD RIGHT HAND  Final   Special Requests BOTTLES DRAWN AEROBIC AND ANAEROBIC 5CC EA  Final   Culture  Setup Time   Final    05/15/2014 00:32 Performed at [facility]    Culture   Final    ESCHERICHIA COLI Note: SUSCEPTIBILITIES PERFORMED ON PREVIOUS CULTURE WITHIN THE LAST 5 DAYS. Note: Gram Stain Report Called to,Read Back By and Verified With: THERNA JEDER 05/15/14 1125 BY SMITHERSJ Performed at [facility]    Report Status 05/17/2014 FINAL  Final  Culture, blood (routine x 2)     Status: None (Preliminary result)   Collection Time: 05/16/14 12:42 AM  Result Value Ref Range Status   Specimen Description BLOOD RIGHT ARM  Final   Special Requests BOTTLES DRAWN AEROBIC AND ANAEROBIC 5CC  Final   Culture  Setup Time   Final    05/16/2014 04:50 Performed at [facility]    Culture   Final           BLOOD CULTURE RECEIVED NO GROWTH TO DATE CULTURE WILL BE HELD FOR 5 DAYS BEFORE ISSUING A FINAL NEGATIVE REPORT Performed at [facility]    Report Status PENDING  Incomplete  Culture, blood (routine x 2)     Status: None (Preliminary result)   Collection Time: 05/16/14 12:47 AM  Result Value Ref Range Status   Specimen Description BLOOD RIGHT HAND  Final   Special Requests BOTTLES DRAWN AEROBIC ONLY 5CC  Final   Culture  Setup Time   Final    05/16/2014 04:50 Performed at [facility]    Culture   Final           BLOOD CULTURE RECEIVED NO GROWTH TO DATE CULTURE WILL BE HELD FOR 5 DAYS BEFORE ISSUING A  FINAL NEGATIVE REPORT Performed at [facility]    Report Status PENDING  Incomplete     Labs: Basic Metabolic Panel:  Recent Labs Lab 05/14/14 1338 05/14/14 1605 05/15/14 0528  NA 135* 135* 136*  K 3.9 4.4 4.0  CL 100 96 101  CO2  --  21 18*  GLUCOSE 284* 254* 290*  BUN 4* 6 9  CREATININE 0.60 0.56 0.79  CALCIUM  --  8.8 7.9*   Liver Function Tests:  Recent Labs Lab 05/15/14 0528  AST 38*  ALT 20  ALKPHOS 125*  BILITOT 0.4  PROT 6.5  ALBUMIN 1.9*    Recent Labs Lab 05/14/14 1605  LIPASE 7*   CBC:  Recent Labs Lab 05/14/14 1323 05/14/14 1338 05/14/14 1605 05/15/14 0528  WBC 19.8*  --  18.2* 17.6*  NEUTROABS 17.0*  --  15.6* 14.4*  HGB 10.1* 11.6* 9.8* 8.3*  HCT 31.2* 34.0* 31.1* 25.9*  MCV 81.9  --  82.5 83.5  PLT 342  --  305 278   Cardiac Enzymes:  Recent Labs Lab 05/14/14 2147  TROPONINI <0.30   CBG:  Recent Labs Lab 05/16/14 0810 05/16/14 1205 05/16/14  1723 05/16/14 2118 05/17/14 0823  GLUCAP 151* 180* 150* 122* 116*       Signed:  Karen Kitchens  [facility] 05/17/2014, 12:22 PM

## 2014-05-17 NOTE — Discharge Instructions (Signed)
Please take Cipro 500 mg twice a day for the next 13 days.     hen PLEASE see a medical Doctor (either the Health Dept or [facility] and wellness about your Diabetes, Anemia, and Bacteremia.    You have been very sick and need to be followed by a physician.

## 2014-05-19 ENCOUNTER — Encounter: Payer: Self-pay | Admitting: Internal Medicine

## 2014-05-19 ENCOUNTER — Ambulatory Visit: Payer: Medicaid Other | Attending: Internal Medicine | Admitting: Internal Medicine

## 2014-05-19 VITALS — BP 148/96 | HR 100 | Temp 98.0°F | Resp 16 | Wt 171.4 lb

## 2014-05-19 DIAGNOSIS — N12 Tubulo-interstitial nephritis, not specified as acute or chronic: Secondary | ICD-10-CM | POA: Diagnosis not present

## 2014-05-19 DIAGNOSIS — I10 Essential (primary) hypertension: Secondary | ICD-10-CM | POA: Diagnosis not present

## 2014-05-19 DIAGNOSIS — Z139 Encounter for screening, unspecified: Secondary | ICD-10-CM

## 2014-05-19 DIAGNOSIS — D72829 Elevated white blood cell count, unspecified: Secondary | ICD-10-CM | POA: Diagnosis not present

## 2014-05-19 DIAGNOSIS — E1165 Type 2 diabetes mellitus with hyperglycemia: Secondary | ICD-10-CM | POA: Diagnosis present

## 2014-05-19 DIAGNOSIS — E139 Other specified diabetes mellitus without complications: Secondary | ICD-10-CM

## 2014-05-19 DIAGNOSIS — D649 Anemia, unspecified: Secondary | ICD-10-CM

## 2014-05-19 DIAGNOSIS — Z794 Long term (current) use of insulin: Secondary | ICD-10-CM | POA: Diagnosis not present

## 2014-05-19 LAB — COMPLETE METABOLIC PANEL WITH GFR
ALT: 21 U/L (ref 0–35)
AST: 13 U/L (ref 0–37)
Albumin: 2.7 g/dL — ABNORMAL LOW (ref 3.5–5.2)
Alkaline Phosphatase: 216 U/L — ABNORMAL HIGH (ref 39–117)
BILIRUBIN TOTAL: 0.3 mg/dL (ref 0.2–1.2)
BUN: 2 mg/dL — ABNORMAL LOW (ref 6–23)
CO2: 25 mEq/L (ref 19–32)
Calcium: 8.8 mg/dL (ref 8.4–10.5)
Chloride: 105 mEq/L (ref 96–112)
Creat: 0.64 mg/dL (ref 0.50–1.10)
GFR, Est African American: >89 mL/min
GLUCOSE: 192 mg/dL — AB (ref 70–99)
Potassium: 4.6 mEq/L (ref 3.5–5.3)
SODIUM: 140 meq/L (ref 135–145)
TOTAL PROTEIN: 7.4 g/dL (ref 6.0–8.3)

## 2014-05-19 LAB — GLUCOSE, POCT (MANUAL RESULT ENTRY): POC Glucose: 200 mg/dl — AB (ref 70–99)

## 2014-05-19 NOTE — Patient Instructions (Signed)
Diabetes Mellitus and Food It is important for you to manage your blood sugar (glucose) level. Your blood glucose level can be greatly affected by what you eat. Eating healthier foods in the appropriate amounts throughout the day at about the same time each day will help you control your blood glucose level. It can also help slow or prevent worsening of your diabetes mellitus. Healthy eating may even help you improve the level of your blood pressure and reach or maintain a healthy weight.  HOW CAN FOOD AFFECT ME? Carbohydrates Carbohydrates affect your blood glucose level more than any other type of food. Your dietitian will help you determine how many carbohydrates to eat at each meal and teach you how to count carbohydrates. Counting carbohydrates is important to keep your blood glucose at a healthy level, especially if you are using insulin or taking certain medicines for diabetes mellitus. Alcohol Alcohol can cause sudden decreases in blood glucose (hypoglycemia), especially if you use insulin or take certain medicines for diabetes mellitus. Hypoglycemia can be a life-threatening condition. Symptoms of hypoglycemia (sleepiness, dizziness, and disorientation) are similar to symptoms of having too much alcohol.  If your health care provider has given you approval to drink alcohol, do so in moderation and use the following guidelines:  Women should not have more than one drink per day, and men should not have more than two drinks per day. One drink is equal to:  12 oz of beer.  5 oz of wine.  1 oz of hard liquor.  Do not drink on an empty stomach.  Keep yourself hydrated. Have water, diet soda, or unsweetened iced tea.  Regular soda, juice, and other mixers might contain a lot of carbohydrates and should be counted. WHAT FOODS ARE NOT RECOMMENDED? As you make food choices, it is important to remember that all foods are not the same. Some foods have fewer nutrients per serving than other  foods, even though they might have the same number of calories or carbohydrates. It is difficult to get your body what it needs when you eat foods with fewer nutrients. Examples of foods that you should avoid that are high in calories and carbohydrates but low in nutrients include:  Trans fats (most processed foods list trans fats on the Nutrition Facts label).  Regular soda.  Juice.  Candy.  Sweets, such as cake, pie, doughnuts, and cookies.  Fried foods. WHAT FOODS CAN I EAT? Have nutrient-rich foods, which will nourish your body and keep you healthy. The food you should eat also will depend on several factors, including:  The calories you need.  The medicines you take.  Your weight.  Your blood glucose level.  Your blood pressure level.  Your cholesterol level. You also should eat a variety of foods, including:  Protein, such as meat, poultry, fish, tofu, nuts, and seeds (lean animal proteins are best).  Fruits.  Vegetables.  Dairy products, such as milk, cheese, and yogurt (low fat is best).  Breads, grains, pasta, cereal, rice, and beans.  Fats such as olive oil, trans fat-free margarine, canola oil, avocado, and olives. DOES EVERYONE WITH DIABETES MELLITUS HAVE THE SAME MEAL PLAN? Because every person with diabetes mellitus is different, there is not one meal plan that works for everyone. It is very important that you meet with a dietitian who will help you create a meal plan that is just right for you. Document Released: 03/20/2005 Document Revised: 06/28/2013 Document Reviewed: 05/20/2013 ExitCare Patient Information 2015 [employer]. This   information is not intended to replace advice given to you by your health care provider. Make sure you discuss any questions you have with your health care provider. DASH Eating Plan DASH stands for "Dietary Approaches to Stop Hypertension." The DASH eating plan is a healthy eating plan that has been shown to reduce high  blood pressure (hypertension). Additional health benefits may include reducing the risk of type 2 diabetes mellitus, heart disease, and stroke. The DASH eating plan may also help with weight loss. WHAT DO I NEED TO KNOW ABOUT THE DASH EATING PLAN? For the DASH eating plan, you will follow these general guidelines:  Choose foods with a percent daily value for sodium of less than 5% (as listed on the food label).  Use salt-free seasonings or herbs instead of table salt or sea salt.  Check with your health care provider or pharmacist before using salt substitutes.  Eat lower-sodium products, often labeled as "lower sodium" or "no salt added."  Eat fresh foods.  Eat more vegetables, fruits, and low-fat dairy products.  Choose whole grains. Look for the word "whole" as the first word in the ingredient list.  Choose fish and skinless chicken or turkey more often than red meat. Limit fish, poultry, and meat to 6 oz (170 g) each day.  Limit sweets, desserts, sugars, and sugary drinks.  Choose heart-healthy fats.  Limit cheese to 1 oz (28 g) per day.  Eat more home-cooked food and less restaurant, buffet, and fast food.  Limit fried foods.  Cook foods using methods other than frying.  Limit canned vegetables. If you do use them, rinse them well to decrease the sodium.  When eating at a restaurant, ask that your food be prepared with less salt, or no salt if possible. WHAT FOODS CAN I EAT? Seek help from a dietitian for individual calorie needs. Grains Whole grain or whole wheat bread. Brown rice. Whole grain or whole wheat pasta. Quinoa, bulgur, and whole grain cereals. Low-sodium cereals. Corn or whole wheat flour tortillas. Whole grain cornbread. Whole grain crackers. Low-sodium crackers. Vegetables Fresh or frozen vegetables (raw, steamed, roasted, or grilled). Low-sodium or reduced-sodium tomato and vegetable juices. Low-sodium or reduced-sodium tomato sauce and paste. Low-sodium  or reduced-sodium canned vegetables.  Fruits All fresh, canned (in natural juice), or frozen fruits. Meat and Other Protein Products Ground beef (85% or leaner), grass-fed beef, or beef trimmed of fat. Skinless chicken or turkey. Ground chicken or turkey. Pork trimmed of fat. All fish and seafood. Eggs. Dried beans, peas, or lentils. Unsalted nuts and seeds. Unsalted canned beans. Dairy Low-fat dairy products, such as skim or 1% milk, 2% or reduced-fat cheeses, low-fat ricotta or cottage cheese, or plain low-fat yogurt. Low-sodium or reduced-sodium cheeses. Fats and Oils Tub margarines without trans fats. Light or reduced-fat mayonnaise and salad dressings (reduced sodium). Avocado. Safflower, olive, or canola oils. Natural peanut or almond butter. Other Unsalted popcorn and pretzels. The items listed above may not be a complete list of recommended foods or beverages. Contact your dietitian for more options. WHAT FOODS ARE NOT RECOMMENDED? Grains White bread. White pasta. White rice. Refined cornbread. Bagels and croissants. Crackers that contain trans fat. Vegetables Creamed or fried vegetables. Vegetables in a cheese sauce. Regular canned vegetables. Regular canned tomato sauce and paste. Regular tomato and vegetable juices. Fruits Dried fruits. Canned fruit in light or heavy syrup. Fruit juice. Meat and Other Protein Products Fatty cuts of meat. Ribs, chicken wings, bacon, sausage, bologna, salami, chitterlings, fatback, hot   dogs, bratwurst, and packaged luncheon meats. Salted nuts and seeds. Canned beans with salt. Dairy Whole or 2% milk, cream, half-and-half, and cream cheese. Whole-fat or sweetened yogurt. Full-fat cheeses or blue cheese. Nondairy creamers and whipped toppings. Processed cheese, cheese spreads, or cheese curds. Condiments Onion and garlic salt, seasoned salt, table salt, and sea salt. Canned and packaged gravies. Worcestershire sauce. Tartar sauce. Barbecue sauce.  Teriyaki sauce. Soy sauce, including reduced sodium. Steak sauce. Fish sauce. Oyster sauce. Cocktail sauce. Horseradish. Ketchup and mustard. Meat flavorings and tenderizers. Bouillon cubes. Hot sauce. Tabasco sauce. Marinades. Taco seasonings. Relishes. Fats and Oils Butter, stick margarine, lard, shortening, ghee, and bacon fat. Coconut, palm kernel, or palm oils. Regular salad dressings. Other Pickles and olives. Salted popcorn and pretzels. The items listed above may not be a complete list of foods and beverages to avoid. Contact your dietitian for more information. WHERE CAN I FIND MORE INFORMATION? National Heart, Lung, and Blood Institute: www.nhlbi.nih.gov/health/health-topics/topics/dash/ Document Released: 06/12/2011 Document Revised: 11/07/2013 Document Reviewed: 04/27/2013 ExitCare Patient Information 2015 [employer]. This information is not intended to replace advice given to you by your health care provider. Make sure you discuss any questions you have with your health care provider.  

## 2014-05-19 NOTE — Progress Notes (Signed)
Patient Demographics  Jane Santiago, is a 28 y.o. female  [id number]  [id number]  DOB - April 16, 1986  CC:  Chief Complaint  Patient presents with  . Hospitalization Follow-up       HPI: Jane Santiago is a 28 y.o. female here today to establish medical care.Patient has history of diabetes, recently hospitalized, EMR reviewed patient had pyelonephritis and sepsis, she was treated with IV antibiotic, CT abdomen showed pyelonephritis/nephrotic stranding on the right, subsequently improved and discharged on Cipro, as patient's heart rate was high she was checked for thyroid which was normal and CT angio  was negative for PE, she also has anemia which is likely secondary to menorrhagia, her diabetes has been poorly uncontrolled secondary to noncompliance her last hemoglobin A1c was 16%, she was discharged on metformin Humalog and Lantus today her blood pressure is elevated but she has not taken her blood pressure medication including lisinopril and metoprolol. As per patient she used to see an endocrinologist in the past and was recommended to continue with insulin and metformin, as per patient she is taking 15 units of Lantus. Today her fasting blood sugar is 200 mg/dL, advise patient to increase the dose of Lantus to 18 units.patient has been taking her antibiotic denies any fever chills. Patient has No headache, No chest pain, No abdominal pain - No Nausea, No new weakness tingling or numbness, No Cough - SOB.  No Known Allergies Past Medical History  Diagnosis Date  . Diabetes mellitus without complication   . Hypertension   . Anemia   . Tachycardia    Current Outpatient Prescriptions on File Prior to Visit  Medication Sig Dispense Refill  . ciprofloxacin (CIPRO) 500 MG tablet Take 1 tablet (500 mg total) by mouth 2 (two) times daily. 26 tablet 0  . insulin glargine (LANTUS) 100 UNIT/ML injection Inject 0.18 mLs (18 Units total) into the skin at bedtime. 10 mL 11  . insulin lispro  (HUMALOG) 100 UNIT/ML injection Inject 0.05 mLs (5 Units total) into the skin daily as needed. 10 mL 11  . lisinopril (PRINIVIL,ZESTRIL) 20 MG tablet Take 1 tablet (20 mg total) by mouth daily. 30 tablet 6  . metFORMIN (GLUCOPHAGE) 1000 MG tablet Take 1 tablet (1,000 mg total) by mouth 2 (two) times daily with a meal. 60 tablet 6  . metoprolol (LOPRESSOR) 50 MG tablet Take 1 tablet (50 mg total) by mouth daily. 30 tablet 6   No current facility-administered medications on file prior to visit.   Family History  Problem Relation Age of Onset  . Thyroid disease Mother   . Diabetes Father    History   Social History  . Marital Status: Single    Spouse Name: N/A    Number of Children: N/A  . Years of Education: N/A   Occupational History  . Not on file.   Social History Main Topics  . Smoking status: Never Smoker   . Smokeless tobacco: Not on file  . Alcohol Use: Yes  . Drug Use: No  . Sexual Activity: Not on file   Other Topics Concern  . Not on file   Social History Narrative    Review of Systems: Constitutional: Negative for fever, chills, diaphoresis, activity change, appetite change and fatigue. HENT: Negative for ear pain, nosebleeds, congestion, facial swelling, rhinorrhea, neck pain, neck stiffness and ear discharge.  Eyes: Negative for pain, discharge, redness, itching and visual disturbance. Respiratory: Negative for cough, choking, chest tightness, shortness of breath, wheezing and  stridor.  Cardiovascular: Negative for chest pain, palpitations and leg swelling. Gastrointestinal: Negative for abdominal distention. Genitourinary: Negative for dysuria, urgency, frequency, hematuria, flank pain, decreased urine volume, difficulty urinating and dyspareunia.  Musculoskeletal: Negative for back pain, joint swelling, arthralgia and gait problem. Neurological: Negative for dizziness, tremors, seizures, syncope, facial asymmetry, speech difficulty, weakness,  light-headedness, numbness and headaches.  Hematological: Negative for adenopathy. Does not bruise/bleed easily. Psychiatric/Behavioral: Negative for hallucinations, behavioral problems, confusion, dysphoric mood, decreased concentration and agitation.    Objective:   Filed Vitals:   05/19/14 0937  BP: 148/96  Pulse: 100  Temp:   Resp:     Physical Exam: Constitutional: Patient appears well-developed and well-nourished. No distress. HENT: Normocephalic, atraumatic, External right and left ear normal. Oropharynx is clear and moist.  Eyes: Conjunctivae and EOM are normal. PERRLA, no scleral icterus. Neck: Normal ROM. Neck supple. No JVD. No tracheal deviation. No thyromegaly. CVS: RRR, S1/S2 +, no murmurs, no gallops, no carotid bruit.  Pulmonary: Effort and breath sounds normal, no stridor, rhonchi, wheezes, rales.  Abdominal: Soft. BS +, no distension, tenderness, rebound or guarding.  Musculoskeletal: Normal range of motion. No edema and no tenderness.  Neuro: Alert. Normal reflexes, muscle tone coordination. No cranial nerve deficit. Skin: Skin is warm and dry. No rash noted. Not diaphoretic. No erythema. No pallor. Psychiatric: Normal mood and affect. Behavior, judgment, thought content normal.  Lab Results  Component Value Date   WBC 17.6* 05/15/2014   HGB 8.3* 05/15/2014   HCT 25.9* 05/15/2014   MCV 83.5 05/15/2014   PLT 278 05/15/2014   Lab Results  Component Value Date   CREATININE 0.79 05/15/2014   BUN 9 05/15/2014   NA 136* 05/15/2014   K 4.0 05/15/2014   CL 101 05/15/2014   CO2 18* 05/15/2014    Lab Results  Component Value Date   HGBA1C 16.0* 05/14/2014   Lipid Panel  No results found for: CHOL, TRIG, HDL, CHOLHDL, VLDL, LDLCALC     Assessment and plan:   1. Other specified diabetes mellitus without complications Results for orders placed or performed in visit on 05/19/14  Glucose (CBG)  Result Value Ref Range   POC Glucose 200 (A) 70 - 99 mg/dl      Diabetes is uncontrolled, her last hemoglobin A1c was 16%, she is resumed back on medications, she'll increase the dose of Lantus to 18 units, continue with metformin Humalog with meals. Advised patient for diabetes meal planning, the fingerstick log.advised patient for compliance with the medications. - Glucose (CBG) - Ambulatory referral to Endocrinology  2. Pyelonephritis Currently patient is on antibiotic denies any fever chills.  3. Leukocytosis Will repeat CBC. - CBC with Differential  4. Anemia, unspecified anemia type  - Anemia panel - CBC with Differential  5. Essential hypertension Blood pressure today is a, advise patient to take her blood pressure medications regularly, also advise for DASH diet. - COMPLETE METABOLIC PANEL WITH GFR  6. Screening  - Ambulatory referral to Gynecology  Health Maintenance  -Pap Smear: referred to GYN  -Vaccinations:  Patient declines flu shot   Return in about 3 months (around 08/19/2014) for diabetes, hypertension.   Lorayne Marek, MD

## 2014-05-19 NOTE — Progress Notes (Signed)
Patient here for hospital follow up Was admitted for kidney infection Presents today with elevated blood pressure But states she had not taken her medication yet today

## 2014-05-20 LAB — ANEMIA PANEL
%SAT: 19 % — ABNORMAL LOW (ref 20–55)
ABS Retic: 26.2 10*3/uL (ref 19.0–186.0)
FOLATE: 10.5 ng/mL
Ferritin: 218 ng/mL (ref 10–291)
Iron: 39 ug/dL — ABNORMAL LOW (ref 42–145)
RBC.: 3.27 MIL/uL — ABNORMAL LOW (ref 3.87–5.11)
RETIC CT PCT: 0.8 % (ref 0.4–2.3)
TIBC: 207 ug/dL — AB (ref 250–470)
UIBC: 168 ug/dL (ref 125–400)
VITAMIN B 12: 1207 pg/mL — AB (ref 211–911)

## 2014-05-20 LAB — CBC WITH DIFFERENTIAL/PLATELET
BASOS PCT: 0 % (ref 0–1)
Basophils Absolute: 0 10*3/uL (ref 0.0–0.1)
EOS ABS: 0.1 10*3/uL (ref 0.0–0.7)
Eosinophils Relative: 1 % (ref 0–5)
HCT: 27.6 % — ABNORMAL LOW (ref 36.0–46.0)
Hemoglobin: 8.6 g/dL — ABNORMAL LOW (ref 12.0–15.0)
Lymphocytes Relative: 36 % (ref 12–46)
Lymphs Abs: 2.8 10*3/uL (ref 0.7–4.0)
MCH: 26.3 pg (ref 26.0–34.0)
MCHC: 31.2 g/dL (ref 30.0–36.0)
MCV: 84.4 fL (ref 78.0–100.0)
Monocytes Absolute: 0.5 10*3/uL (ref 0.1–1.0)
Monocytes Relative: 7 % (ref 3–12)
Neutro Abs: 4.4 10*3/uL (ref 1.7–7.7)
Neutrophils Relative %: 56 % (ref 43–77)
PLATELETS: 419 10*3/uL — AB (ref 150–400)
RBC: 3.27 MIL/uL — ABNORMAL LOW (ref 3.87–5.11)
RDW: 13.8 % (ref 11.5–15.5)
WBC: 7.8 10*3/uL (ref 4.0–10.5)

## 2014-05-22 LAB — CULTURE, BLOOD (ROUTINE X 2)
Culture: NO GROWTH
Culture: NO GROWTH

## 2014-09-13 ENCOUNTER — Ambulatory Visit: Payer: Self-pay | Admitting: Obstetrics

## 2015-03-09 ENCOUNTER — Ambulatory Visit: Payer: Medicaid Other | Admitting: Family Medicine

## 2015-03-30 ENCOUNTER — Telehealth: Payer: Self-pay

## 2015-03-30 ENCOUNTER — Telehealth: Payer: Self-pay | Admitting: General Practice

## 2015-03-30 NOTE — Telephone Encounter (Signed)
Returned patient phone call Patient stated she was seen in the urgent care this am for a UTI At her appt her sugar was elevated Patient has not been seen here since 05/2014 Patient was informed she needs to make a follow up appt. Here to monitor Her diabetes

## 2015-03-30 NOTE — Telephone Encounter (Signed)
Patient is at urgent care as of the moment and her blood sugar is 410. Nurse called wanting to speak with provider about her sugar levels. She was last seen by dr Annitta Needs last November 2015. Patient is also interested in re-establishing care. Please follow up with patient about advise.

## 2015-04-02 ENCOUNTER — Telehealth: Payer: Self-pay

## 2015-04-02 NOTE — Telephone Encounter (Signed)
Patient left message on nurse's voicemail. Nurse called patient, patient verified date of birth. Patient went to Urgent Care Friday for UTI symptoms and had high sugar.  Patient is on antibiotics currently for UTI. Blood glucose has came back down. Fasting blood glucose was 303 this morning fasting. Patient ran out of insulin and has just started back on insulin.  Blood glucose had went down to 207 after breakfast. Patient has gotten insulin refilled and is checking blood glucose 5 times daily. Patient is on humalog and lantus. Patient takes 1000mg  of metformin twice daily.  Patient requesting appointment and needs new PCP at [facility]. She was previously Dr. Conley Canal patient. Nurse transferred patient to front office staff to schedule appointment.

## 2015-04-04 ENCOUNTER — Encounter: Payer: Self-pay | Admitting: Physician Assistant

## 2015-04-04 ENCOUNTER — Ambulatory Visit: Payer: Medicaid Other | Attending: Physician Assistant | Admitting: Physician Assistant

## 2015-04-04 VITALS — BP 114/81 | HR 88 | Temp 98.4°F | Resp 18 | Ht 61.0 in | Wt 173.0 lb

## 2015-04-04 DIAGNOSIS — Z79899 Other long term (current) drug therapy: Secondary | ICD-10-CM | POA: Insufficient documentation

## 2015-04-04 DIAGNOSIS — E1165 Type 2 diabetes mellitus with hyperglycemia: Secondary | ICD-10-CM | POA: Insufficient documentation

## 2015-04-04 DIAGNOSIS — I10 Essential (primary) hypertension: Secondary | ICD-10-CM | POA: Diagnosis not present

## 2015-04-04 DIAGNOSIS — E119 Type 2 diabetes mellitus without complications: Secondary | ICD-10-CM

## 2015-04-04 DIAGNOSIS — Z794 Long term (current) use of insulin: Secondary | ICD-10-CM | POA: Insufficient documentation

## 2015-04-04 DIAGNOSIS — N390 Urinary tract infection, site not specified: Secondary | ICD-10-CM | POA: Insufficient documentation

## 2015-04-04 DIAGNOSIS — Z9119 Patient's noncompliance with other medical treatment and regimen: Secondary | ICD-10-CM | POA: Insufficient documentation

## 2015-04-04 DIAGNOSIS — E1140 Type 2 diabetes mellitus with diabetic neuropathy, unspecified: Secondary | ICD-10-CM | POA: Insufficient documentation

## 2015-04-04 LAB — GLUCOSE, POCT (MANUAL RESULT ENTRY): POC GLUCOSE: 207 mg/dL — AB (ref 70–99)

## 2015-04-04 LAB — POCT GLYCOSYLATED HEMOGLOBIN (HGB A1C): Hemoglobin A1C: >15

## 2015-04-04 NOTE — Progress Notes (Signed)
Patient went to Urgent Care for UTI. Patient reports feeling a lot better. According to urine culture, first antibiotic did not work so Urgent Care prescribed second antibiotic.   Sugar was high at Urgent Care. Patient started insulin again and sugars have been better. Runs around 200 but yesterday it was 368 after lunch.   Current blood glucose is 207, patient is fasting.

## 2015-04-04 NOTE — Patient Instructions (Addendum)
Blood sugar check at least 3 times per day and bring a log to your next appointment Return in 4 weeks to see Chari Manning, NP Complete the full course of antibiotics Do 30 min of walking briskly 3-5 times per day

## 2015-04-04 NOTE — Progress Notes (Signed)
Chief Complaint: Follow up recent UTI and high blood sugar  Subjective: This is a 29 year old female with a history of diabetes and hypertension. Unfortunately she has some noncompliance. She has not been on insulin for at least 3 months until last week when she suffered a urinary tract infection and went to the local urgent care. There she was noted to have a blood sugar of greater than 400. They did not treat her there with insulin coverage. They did encourage her to go to the emergency department but she had her children with her and was unable to go. She called here and we gave her refills and she has been back on her regimen for approximately 5 days. Her blood sugars have fluctuated from 207-360 at home. Her sugar today is 207. Her hemoglobin A1c is greater than 15. She is taken Lantus 18 units at night. She is taking metformin 1000 mg twice daily. She is covering herself with Humalog 3 times daily at 5 units. She never stopped taking her antihypertensives or oral diabetic drug, just the insulin.  She has not been at this clinic in at least 9 months. At that time she saw Dr. Annitta Needs following hospitalization for sepsis/pyelonephritis. Her hemoglobin A1c was elevated at that time as well. This is when her Lantus from 15 units at night 18 units at night.  From a urinary tract infection standpoint her symptoms have improved. She denies dysuria. She denies burning. She denies frequency. She is taking an antibiotic twice daily and will complete the course. She's not that active. She is a nonsmoker. She denies chest pain, shortness of breath or lightheadedness.  She does state that she has been having a lot of pain, shooting pains, in her lower extremities bilaterally. Worse with rest. Better with walking. At least 3-4 times weekly. She believes that it is related to diabetic nerve pain.   ROS:  GEN: denies fever or chills, denies change in weight Skin: denies lesions or rashes HEENT: denies headache,  earache, epistaxis, sore throat, or neck pain LUNGS: denies SHOB, dyspnea, PND, orthopnea CV: denies CP or palpitations ABD: denies abd pain, N or V EXT: denies muscle spasms or swelling; + pain in lower ext, no weakness NEURO: denies numbness or tingling, denies sz, stroke or TIA   Objective:  Filed Vitals:   04/04/15 0914  BP: 114/81  Pulse: 88  Temp: 98.4 F (36.9 C)  TempSrc: Oral  Resp: 18  Height: 5\' 1"  (1.549 m)  Weight: 173 lb (78.472 kg)  SpO2: 100%    Physical Exam:  General: in no acute distress. HEENT: no pallor, no icterus, moist oral mucosa, no JVD, no lymphadenopathy Heart: Normal  s1 &s2  Regular rate and rhythm, without murmurs, rubs, gallops. Lungs: Clear to auscultation bilaterally. Abdomen: Soft, nontender, nondistended, positive bowel sounds. Extremities: No clubbing cyanosis or edema with positive pedal pulses. Neuro: Alert, awake, oriented x3, nonfocal.  Pertinent Lab Results:CBG=207; A1C=>15   Medications: Prior to Admission medications   Medication Sig Start Date End Date Taking? Authorizing Provider  insulin glargine (LANTUS) 100 UNIT/ML injection Inject 0.18 mLs (18 Units total) into the skin at bedtime. 05/17/14  Yes Marianne L York, PA-C  insulin lispro (HUMALOG) 100 UNIT/ML injection Inject 0.05 mLs (5 Units total) into the skin daily as needed. 05/17/14  Yes Marianne L York, PA-C  lisinopril (PRINIVIL,ZESTRIL) 20 MG tablet Take 1 tablet (20 mg total) by mouth daily. 05/17/14  Yes Marianne L York, PA-C  metFORMIN (GLUCOPHAGE) 1000 MG  tablet Take 1 tablet (1,000 mg total) by mouth 2 (two) times daily with a meal. 05/17/14  Yes Bobby Rumpf York, PA-C  metoprolol (LOPRESSOR) 50 MG tablet Take 1 tablet (50 mg total) by mouth daily. 05/17/14  Yes Bobby Rumpf York, PA-C  ciprofloxacin (CIPRO) 500 MG tablet Take 1 tablet (500 mg total) by mouth 2 (two) times daily. Patient not taking: Reported on 04/04/2015 05/17/14   Melton Alar, PA-C     Assessment: 1. UTI-treated, 3 days left of antibiotics 2. DM2 with Hyperglycemia and diabetic neuropathy 3. HTN-controlled  Plan: Encouraged to complete the full course of antibiotics Encouraged compliance with meds/insulin Sugar check 3-4 X/day and bring a log to the next visit I did not adjust her Insulin as she has only been taking again for 5 days Encouraged 30 min aerobic activity 3-5 times/week DASH/low CARB diet Cont currene meds  Follow up:4 weeks for routine health maintenance   This note has been created with Surveyor, quantity. Any transcriptional errors are unintentional.   Zettie Pho, PA-C 04/04/2015, 9:42 AM

## 2015-05-21 ENCOUNTER — Encounter: Payer: Self-pay | Admitting: Internal Medicine

## 2015-05-21 ENCOUNTER — Ambulatory Visit: Payer: Medicaid Other | Attending: Internal Medicine | Admitting: Internal Medicine

## 2015-05-21 VITALS — BP 138/88 | HR 92 | Temp 98.0°F | Resp 16 | Ht 61.0 in | Wt 179.0 lb

## 2015-05-21 DIAGNOSIS — E049 Nontoxic goiter, unspecified: Secondary | ICD-10-CM

## 2015-05-21 DIAGNOSIS — E119 Type 2 diabetes mellitus without complications: Secondary | ICD-10-CM | POA: Diagnosis not present

## 2015-05-21 DIAGNOSIS — I10 Essential (primary) hypertension: Secondary | ICD-10-CM | POA: Diagnosis not present

## 2015-05-21 DIAGNOSIS — E1142 Type 2 diabetes mellitus with diabetic polyneuropathy: Secondary | ICD-10-CM | POA: Diagnosis not present

## 2015-05-21 DIAGNOSIS — E010 Iodine-deficiency related diffuse (endemic) goiter: Secondary | ICD-10-CM

## 2015-05-21 DIAGNOSIS — D509 Iron deficiency anemia, unspecified: Secondary | ICD-10-CM

## 2015-05-21 LAB — CBC WITH DIFFERENTIAL/PLATELET
Basophils Absolute: 0 10*3/uL (ref 0.0–0.1)
Basophils Relative: 0 % (ref 0–1)
Eosinophils Absolute: 0.1 10*3/uL (ref 0.0–0.7)
Eosinophils Relative: 1 % (ref 0–5)
HEMATOCRIT: 39.1 % (ref 36.0–46.0)
HEMOGLOBIN: 12.6 g/dL (ref 12.0–15.0)
LYMPHS ABS: 3.1 10*3/uL (ref 0.7–4.0)
LYMPHS PCT: 39 % (ref 12–46)
MCH: 27.2 pg (ref 26.0–34.0)
MCHC: 32.2 g/dL (ref 30.0–36.0)
MCV: 84.4 fL (ref 78.0–100.0)
MONO ABS: 0.6 10*3/uL (ref 0.1–1.0)
MONOS PCT: 7 % (ref 3–12)
MPV: 11.4 fL (ref 8.6–12.4)
NEUTROS ABS: 4.2 10*3/uL (ref 1.7–7.7)
Neutrophils Relative %: 53 % (ref 43–77)
Platelets: 325 10*3/uL (ref 150–400)
RBC: 4.63 MIL/uL (ref 3.87–5.11)
RDW: 13 % (ref 11.5–15.5)
WBC: 8 10*3/uL (ref 4.0–10.5)

## 2015-05-21 LAB — GLUCOSE, POCT (MANUAL RESULT ENTRY): POC Glucose: 194 mg/dl — AB (ref 70–99)

## 2015-05-21 MED ORDER — INSULIN LISPRO 100 UNIT/ML ~~LOC~~ SOLN: 5.0000 [IU] | Freq: Every day | SUBCUTANEOUS | Status: DC | PRN

## 2015-05-21 MED ORDER — GABAPENTIN 300 MG PO CAPS: 300.0000 mg | ORAL_CAPSULE | Freq: Every day | ORAL | Status: DC

## 2015-05-21 MED ORDER — INSULIN GLARGINE 100 UNIT/ML ~~LOC~~ SOLN: 22.0000 [IU] | Freq: Every day | SUBCUTANEOUS | Status: DC

## 2015-05-21 MED ORDER — METFORMIN HCL 1000 MG PO TABS: 1000.0000 mg | ORAL_TABLET | Freq: Two times a day (BID) | ORAL | Status: DC

## 2015-05-21 MED ORDER — LISINOPRIL 20 MG PO TABS: 20.0000 mg | ORAL_TABLET | Freq: Every day | ORAL | Status: DC

## 2015-05-21 MED ORDER — METOPROLOL TARTRATE 50 MG PO TABS: 50.0000 mg | ORAL_TABLET | Freq: Every day | ORAL | Status: DC

## 2015-05-21 NOTE — Patient Instructions (Signed)
Call when approved for medicaid so that we can get a ultrasound of your thyroid  Increased Lantus to 22 units at bedtime. Please bring log book with you to every visit  Gabapentin for diabetic neuropathy. Talk half a tablet at bedtime for one week and then increase to the whole tablet next week.   I will see you for a follow up in January, will repeat A1C at that time

## 2015-05-21 NOTE — Progress Notes (Signed)
Patient here for follow up on her diabetes and for medication refills Patient has declined the flu vaccine

## 2015-05-21 NOTE — Progress Notes (Signed)
Patient ID: Jane Santiago, female   DOB: 1985/09/29, 29 y.o.   MRN: [id number]  CC: diabetes f/u  HPI: Jane Santiago is a 29 y.o. female here today for a follow up visit.  Patient has past medical history of hypertension and diabetes. Patient was recently seen less than 2 months ago and was found to be non-complaint at that time by not having insulin in over three months. Since that time patient reports that she is taken Lantus 18 units at night. She is taking metformin 1000 mg twice daily. She is covering herself with Humalog 3 times daily at 5 units. She never stopped taking her antihypertensives or oral diabetic drug. Today she reports that her fasting blood sugars are ranging from 200-285. Has numbness in feet for past 9 months. Pain is usually in the knees and goes down to her toes, worse at bedtime. She denies blurred vision or polyuria. Patient states that she has been taking iron pills once per day since she was told that she is anemic last year while hospitalized.  She would like her levels rechecked.   No Known Allergies Past Medical History  Diagnosis Date  . Diabetes mellitus without complication ([place])   . Hypertension   . Anemia   . Tachycardia    Current Outpatient Prescriptions on File Prior to Visit  Medication Sig Dispense Refill  . insulin glargine (LANTUS) 100 UNIT/ML injection Inject 0.18 mLs (18 Units total) into the skin at bedtime. 10 mL 11  . insulin lispro (HUMALOG) 100 UNIT/ML injection Inject 0.05 mLs (5 Units total) into the skin daily as needed. 10 mL 11  . lisinopril (PRINIVIL,ZESTRIL) 20 MG tablet Take 1 tablet (20 mg total) by mouth daily. 30 tablet 6  . metFORMIN (GLUCOPHAGE) 1000 MG tablet Take 1 tablet (1,000 mg total) by mouth 2 (two) times daily with a meal. 60 tablet 6  . metoprolol (LOPRESSOR) 50 MG tablet Take 1 tablet (50 mg total) by mouth daily. 30 tablet 6  . ciprofloxacin (CIPRO) 500 MG tablet Take 1 tablet (500 mg total) by mouth 2 (two) times daily.  (Patient not taking: Reported on 04/04/2015) 26 tablet 0   No current facility-administered medications on file prior to visit.   Family History  Problem Relation Age of Onset  . Thyroid disease Mother   . Diabetes Father    Social History   Social History  . Marital Status: Single    Spouse Name: N/A  . Number of Children: N/A  . Years of Education: N/A   Occupational History  . Not on file.   Social History Main Topics  . Smoking status: Never Smoker   . Smokeless tobacco: Not on file  . Alcohol Use: No  . Drug Use: No  . Sexual Activity: Not on file   Other Topics Concern  . Not on file   Social History Narrative    Review of Systems: Other than what is stated in HPI, all other systems are negative.   Objective:   Filed Vitals:   05/21/15 1547  BP: 138/88  Pulse: 92  Temp: 98 F (36.7 C)  Resp: 16    Physical Exam  Constitutional: She is oriented to person, place, and time.  Neck: Thyromegaly present.  Cardiovascular: Normal rate, regular rhythm and normal heart sounds.   Pulmonary/Chest: Effort normal and breath sounds normal.  Feet:  Right Foot:  Protective Sensation: 10 sites tested.10 sites sensed. Skin Integrity: Negative for skin breakdown.  Left Foot:  Protective Sensation: 10 sites tested. 10 sites sensed. Skin Integrity: Negative for skin breakdown.  Neurological: She is alert and oriented to person, place, and time.  Skin: Skin is warm and dry.  Psychiatric: She has a normal mood and affect.     Lab Results  Component Value Date   WBC 7.8 05/19/2014   HGB 8.6* 05/19/2014   HCT 27.6* 05/19/2014   MCV 84.4 05/19/2014   PLT 419* 05/19/2014   Lab Results  Component Value Date   CREATININE 0.64 05/19/2014   BUN 2* 05/19/2014   NA 140 05/19/2014   K 4.6 05/19/2014   CL 105 05/19/2014   CO2 25 05/19/2014    Lab Results  Component Value Date   HGBA1C >15.0 04/04/2015   Lipid Panel  No results found for: CHOL, TRIG, HDL,  CHOLHDL, VLDL, LDLCALC     Assessment and plan:   Jane Santiago was seen today for follow-up.  Diagnoses and all orders for this visit:  Type 2 diabetes mellitus without complication, without long-term current use of insulin ([facility]CC) -     insulin glargine (LANTUS) 100 UNIT/ML injection; Inject 0.22 mLs (22 Units total) into the skin at bedtime. -     insulin lispro (HUMALOG) 100 UNIT/ML injection; Inject 0.05 mLs (5 Units total) into the skin daily as needed. -     metFORMIN (GLUCOPHAGE) 1000 MG tablet; Take 1 tablet (1,000 mg total) by mouth 2 (two) times daily with a meal. -     Glucose (CBG) -     Microalbumin, urine I have increased her Lantus to 22 units at bedtime. She will bring sugar log back in 2 weeks for review and additional changes as necessary.  Stressed long term complications of uncontrolled diabetes.   Diabetic polyneuropathy associated with type 2 diabetes mellitus (HCC) -     gabapentin (NEURONTIN) 300 MG capsule; Take 1 capsule (300 mg total) by mouth at bedtime. Take half tablet for one week then switch to whole tablet at bedtime Will begin low dose gabapentin. Some neuropathy may improve once she has better glycemic control.   Essential hypertension -     lisinopril (PRINIVIL,ZESTRIL) 20 MG tablet; Take 1 tablet (20 mg total) by mouth daily. -     metoprolol (LOPRESSOR) 50 MG tablet; Take 1 tablet (50 mg total) by mouth daily. -     Basic Metabolic Panel Patient blood pressure is stable and may continue on current medication.  Education on diet, exercise, and modifiable risk factors discussed. Will obtain appropriate labs as needed. Will follow up in 3-6 months.   Thyromegaly -     TSH -     T4, Free Thyroid is very large, she will need a thyroid ultrasound but just found out her Medicaid is inactive. Will order once she calls me back.  Iron deficiency anemia -     CBC with Differential Last hgb was 8.7 last year. Will recheck today.  Return in about 2 weeks (around  06/04/2015) for Nurse Visit-log review and 2 mo PCP DM/HTN.      Lance Bosch, [facility] [phone] 05/21/2015, 4:07 PM '

## 2015-05-22 ENCOUNTER — Telehealth: Payer: Self-pay

## 2015-05-22 LAB — T4, FREE: Free T4: 1.2 ng/dL (ref 0.80–1.80)

## 2015-05-22 LAB — BASIC METABOLIC PANEL
BUN: 12 mg/dL (ref 7–25)
CO2: 29 mmol/L (ref 20–31)
Calcium: 9 mg/dL (ref 8.6–10.2)
Chloride: 102 mmol/L (ref 98–110)
Creat: 0.79 mg/dL (ref 0.50–1.10)
Glucose, Bld: 238 mg/dL — ABNORMAL HIGH (ref 65–99)
Potassium: 5.2 mmol/L (ref 3.5–5.3)
Sodium: 137 mmol/L (ref 135–146)

## 2015-05-22 LAB — MICROALBUMIN, URINE: Microalb, Ur: 130 mg/dL

## 2015-05-22 LAB — TSH: TSH: 1.358 u[IU]/mL (ref 0.350–4.500)

## 2015-05-22 MED ORDER — GLUCOSE BLOOD VI STRP: ORAL_STRIP | Status: DC

## 2015-05-22 NOTE — Telephone Encounter (Signed)
-----   Message from Lance Bosch, NP sent at 05/22/2015 11:00 AM EST ----- She has a ton of protein in her urine. Make sure she is taking the Lisinopril daily. Thyroid is normal but we do need to get a ultrasound when she gets her insurance, if not please have her to apply for hospital discount.

## 2015-05-22 NOTE — Telephone Encounter (Signed)
Spoke with patient and she is aware of her results RX for test strips sent to her pharmacy

## 2015-05-24 ENCOUNTER — Other Ambulatory Visit: Payer: Self-pay | Admitting: Internal Medicine

## 2015-05-24 DIAGNOSIS — E010 Iodine-deficiency related diffuse (endemic) goiter: Secondary | ICD-10-CM

## 2015-05-25 ENCOUNTER — Telehealth: Payer: Self-pay

## 2015-05-25 NOTE — Telephone Encounter (Signed)
Attempted to contact patient to let her know her thyroid US has Been scheduled Appointment date is December 2nd at 8:30 AM Patient is to arrive at 8:15 to register Patient was not available Message left on voice mail to return our call

## 2015-05-29 ENCOUNTER — Telehealth: Payer: Self-pay

## 2015-05-29 ENCOUNTER — Other Ambulatory Visit: Payer: Self-pay | Admitting: Internal Medicine

## 2015-05-29 DIAGNOSIS — E1142 Type 2 diabetes mellitus with diabetic polyneuropathy: Secondary | ICD-10-CM

## 2015-05-29 MED ORDER — GABAPENTIN 300 MG PO CAPS: 300.0000 mg | ORAL_CAPSULE | Freq: Every day | ORAL | Status: DC

## 2015-05-29 NOTE — Telephone Encounter (Signed)
Nurse called patient, patient verified date of birth. Patient reports medications are on hold Patient has been without  Diabetic medications for 1 week.  Patient reports pharmacy has some medications on hold and does not have refill requests for other medications.  Nurse called pharmacy. Per pharmacy, pharmacy has all medication refills and needs clarification on gabapentin.  Gabapentin instructions as follows: "Take 1 capsule (300 mg total) by mouth at bedtime. Take half tablet for one week then switch to whole tablet at bedtime". Gabapentin order is capsules, can not be halved. Nurse will send message to provider to clarify instructions for gabapentin. Patient questioning status on medical papers she gave to provider on 05/21/15.

## 2015-05-29 NOTE — Telephone Encounter (Signed)
Papers are not complete. I will change gabapentin directions. She needs to get all other diabetes medications

## 2015-06-05 ENCOUNTER — Encounter: Payer: Medicaid Other | Admitting: Pharmacist

## 2015-06-05 NOTE — Telephone Encounter (Signed)
Nurse called patient on mobile number, reached voicemail. Left message for patient to call Cloria Ciresi with [facility], at [phone]. Nurse called home number, number has been disconnected.  Nurse called patient to make patient aware of papers not completed, provider sent gabapentin to pharmacy with correct directions, and patient needs to pick up all other diabetes medications.

## 2015-06-05 NOTE — Telephone Encounter (Signed)
Nurse called patient, patient verified date of birth. Patient aware of paperwork not completed. Patient has picked up gabapentin and all other diabetic medications on hold at pharmacy. Patient had appointment today and had car trouble.  Patient has rescheduled visit for next week and will bring glucometer with her to appointment.  Patient voices understanding and has no further questions at this time.

## 2015-06-06 ENCOUNTER — Telehealth: Payer: Self-pay | Admitting: Internal Medicine

## 2015-06-06 NOTE — Telephone Encounter (Signed)
Called Patient to inform that paperwork is ready for pick up. LVM

## 2015-06-08 ENCOUNTER — Encounter: Payer: Self-pay | Admitting: Internal Medicine

## 2015-06-08 ENCOUNTER — Ambulatory Visit (HOSPITAL_COMMUNITY)
Admission: RE | Admit: 2015-06-08 | Discharge: 2015-06-08 | Disposition: A | Discharge status: Home / Self Care | Payer: Medicaid Other | Source: Ambulatory Visit | Attending: Internal Medicine | Admitting: Internal Medicine

## 2015-06-08 ENCOUNTER — Telehealth: Payer: Self-pay

## 2015-06-08 DIAGNOSIS — E010 Iodine-deficiency related diffuse (endemic) goiter: Secondary | ICD-10-CM | POA: Diagnosis present

## 2015-06-08 DIAGNOSIS — E042 Nontoxic multinodular goiter: Secondary | ICD-10-CM | POA: Insufficient documentation

## 2015-06-08 NOTE — Telephone Encounter (Signed)
-----   Message from Lance Bosch, NP sent at 06/08/2015 11:27 AM EST ----- See previous note. Revealed nodules that need to be biopsied

## 2015-06-08 NOTE — Telephone Encounter (Signed)
Attempted to contact patient  Home number has been disconnected Message left on voice mail on her cell number to return our call

## 2015-06-12 ENCOUNTER — Telehealth: Payer: Self-pay

## 2015-06-12 NOTE — Telephone Encounter (Signed)
Patient returned phone call and is aware of her lab results 

## 2015-06-14 ENCOUNTER — Ambulatory Visit: Payer: Medicaid Other | Attending: Internal Medicine | Admitting: Pharmacist

## 2015-06-14 DIAGNOSIS — E119 Type 2 diabetes mellitus without complications: Secondary | ICD-10-CM | POA: Insufficient documentation

## 2015-06-14 DIAGNOSIS — Z794 Long term (current) use of insulin: Secondary | ICD-10-CM | POA: Diagnosis not present

## 2015-06-14 MED ORDER — METFORMIN HCL ER 500 MG PO TB24: 1000.0000 mg | ORAL_TABLET | Freq: Two times a day (BID) | ORAL | Status: DC

## 2015-06-14 MED ORDER — INSULIN GLARGINE 100 UNIT/ML ~~LOC~~ SOLN: 30.0000 [IU] | Freq: Every day | SUBCUTANEOUS | Status: DC

## 2015-06-14 NOTE — Progress Notes (Signed)
S:    Patient arrives in good spirits.  Presents for diabetes follow up.   Patient reports adherence with medications. Current diabetes medications include Lantus 22 units daily and Humalog 5 units daily as needed.   Patient denies hypoglycemic events.  Patient reported dietary habits: doesn't follow a particular diet, works two jobs so it is very difficult to have time to eat and to prepare her meals.  Patient reported exercise habits: doesn't really exercise   Patient reports nocturia 2-3 times per night.  Patient reports neuropathy but gabapentin has helped. Patient denies visual changes. Patient reports self foot exams.   Patient reports that she has felt very sluggish and she knows that it is due to her blood glucose. She would really like to work on getting them down because she would like to start feeling better.  O:  Lab Results  Component Value Date   HGBA1C >15.0 04/04/2015    Home fasting CBG: 208 (before visit) - 438 (mostly 300s) 2 hour post-prandial/random CBG: 224 - 385.  A/P: Diabetes currently uncontrolled based on A1c of 15 and home CBGs.   Patient denies hypoglycemic events and is able to verbalize appropriate hypoglycemia management plan.  Patient reports adherence with medication. Control is suboptimal due to dietary indiscretion and sedentary lifestyle.   Increased dose of basal insulin Lantus (insulin glargine) to 30 units daily. Continued rapid insulin Humalog (insulin lispro) at 5 units as needed. Will focus on getting fasting blood glucose to goal (<120) first. Patient instructed to let us know if she has any hypoglycemia. Discussed being aggressive with getting her blood glucose down as it has impacted her work and patient agreed to this plan.   Patient has had diarrhea with regular release metformin. Will order extended release metformin and see if this makes the diarrhea resolve.   Next A1C anticipated January 2017.    Written patient instructions  provided.  Total time in face to face counseling 20 minutes.  Follow up in Pharmacist Clinic Visit in 1-2 weeks.

## 2015-06-14 NOTE — Patient Instructions (Signed)
Increase your Lantus to 30 units daily.  Come back and see me in 2-3 weeks for blood sugar log review.  Come back sooner if you have any hypoglycemia  Hypoglycemia Low blood sugar (hypoglycemia) means that the level of sugar in your blood is lower than it should be. Signs of low blood sugar include:  Getting sweaty.  Feeling hungry.  Feeling dizzy or weak.  Feeling sleepier than normal.  Feeling nervous.  Headaches.  Having a fast heartbeat. Low blood sugar can happen fast and can be an emergency. Your doctor can do tests to check your blood sugar level. You can have low blood sugar and not have diabetes. HOME CARE  Check your blood sugar as told by your doctor. If it is less than 70 mg/dl or as told by your doctor, take 1 of the following:  3 to 4 glucose tablets.   cup clear juice.   cup soda pop, not diet.  1 cup milk.  5 to 6 hard candies.  Recheck blood sugar after 15 minutes. Repeat until it is at the right level.  Eat a snack if it is more than 1 hour until the next meal.  Only take medicine as told by your doctor.  Do not skip meals. Eat on time.  Do not drink alcohol except with meals.  Check your blood glucose before driving.  Check your blood glucose before and after exercise.  Always carry treatment with you, such as glucose pills.  Always wear a medical alert bracelet if you have diabetes. GET HELP RIGHT AWAY IF:   Your blood glucose goes below 70 mg/dl or as told by your doctor, and you:  Are confused.  Are not able to swallow.  Pass out (faint).  You cannot treat yourself. You may need someone to help you.  You have low blood sugar problems often.  You have problems from your medicines.  You are not feeling better after 3 to 4 days.  You have vision changes. MAKE SURE YOU:   Understand these instructions.  Will watch this condition.  Will get help right away if you are not doing well or get worse.   This information is  not intended to replace advice given to you by your health care provider. Make sure you discuss any questions you have with your health care provider.   Document Released: 09/17/2009 Document Revised: 07/14/2014 Document Reviewed: 02/27/2015 Elsevier Interactive Patient Education [employer].

## 2015-07-05 ENCOUNTER — Encounter: Payer: Medicaid Other | Admitting: Pharmacist

## 2015-08-17 ENCOUNTER — Ambulatory Visit: Payer: Medicaid Other | Admitting: Internal Medicine

## 2016-02-19 ENCOUNTER — Encounter (HOSPITAL_COMMUNITY): Payer: Self-pay | Admitting: *Deleted

## 2016-02-19 ENCOUNTER — Emergency Department (HOSPITAL_COMMUNITY)
Admission: EM | Admit: 2016-02-19 | Discharge: 2016-02-19 | Disposition: A | Discharge status: Home / Self Care | Payer: Medicaid Other | Attending: Emergency Medicine | Admitting: Emergency Medicine

## 2016-02-19 DIAGNOSIS — E119 Type 2 diabetes mellitus without complications: Secondary | ICD-10-CM | POA: Diagnosis not present

## 2016-02-19 DIAGNOSIS — Z7984 Long term (current) use of oral hypoglycemic drugs: Secondary | ICD-10-CM | POA: Insufficient documentation

## 2016-02-19 DIAGNOSIS — Z794 Long term (current) use of insulin: Secondary | ICD-10-CM | POA: Diagnosis not present

## 2016-02-19 DIAGNOSIS — Z79899 Other long term (current) drug therapy: Secondary | ICD-10-CM | POA: Insufficient documentation

## 2016-02-19 DIAGNOSIS — Z048 Encounter for examination and observation for other specified reasons: Secondary | ICD-10-CM | POA: Diagnosis present

## 2016-02-19 DIAGNOSIS — I159 Secondary hypertension, unspecified: Secondary | ICD-10-CM | POA: Diagnosis not present

## 2016-02-19 LAB — CBC WITH DIFFERENTIAL/PLATELET
BASOS PCT: 0 %
Basophils Absolute: 0 10*3/uL (ref 0.0–0.1)
EOS ABS: 0.1 10*3/uL (ref 0.0–0.7)
Eosinophils Relative: 1 %
HCT: 36.2 % (ref 36.0–46.0)
Hemoglobin: 12.5 g/dL (ref 12.0–15.0)
Lymphocytes Relative: 39 %
Lymphs Abs: 2.8 10*3/uL (ref 0.7–4.0)
MCH: 28 pg (ref 26.0–34.0)
MCHC: 34.5 g/dL (ref 30.0–36.0)
MCV: 81.2 fL (ref 78.0–100.0)
MONO ABS: 0.3 10*3/uL (ref 0.1–1.0)
MONOS PCT: 4 %
Neutro Abs: 4 10*3/uL (ref 1.7–7.7)
Neutrophils Relative %: 56 %
Platelets: 249 10*3/uL (ref 150–400)
RBC: 4.46 MIL/uL (ref 3.87–5.11)
RDW: 12 % (ref 11.5–15.5)
WBC: 7.2 10*3/uL (ref 4.0–10.5)

## 2016-02-19 LAB — BASIC METABOLIC PANEL
ANION GAP: 6 (ref 5–15)
BUN: 13 mg/dL (ref 6–20)
CALCIUM: 9 mg/dL (ref 8.9–10.3)
CO2: 25 mmol/L (ref 22–32)
Chloride: 105 mmol/L (ref 101–111)
Creatinine, Ser: 0.93 mg/dL (ref 0.44–1.00)
GFR calc Af Amer: >60 mL/min (ref >60)
GLUCOSE: 356 mg/dL — AB (ref 65–99)
Potassium: 3.9 mmol/L (ref 3.5–5.1)
Sodium: 136 mmol/L (ref 135–145)

## 2016-02-19 LAB — CBG MONITORING, ED: Glucose-Capillary: 362 mg/dL — ABNORMAL HIGH (ref 65–99)

## 2016-02-19 LAB — URINALYSIS, ROUTINE W REFLEX MICROSCOPIC
Bilirubin Urine: NEGATIVE
KETONES UR: NEGATIVE mg/dL
LEUKOCYTES UA: NEGATIVE
Nitrite: NEGATIVE
Specific Gravity, Urine: 1.024 (ref 1.005–1.030)
pH: 6 (ref 5.0–8.0)

## 2016-02-19 LAB — URINE MICROSCOPIC-ADD ON

## 2016-02-19 LAB — POC URINE PREG, ED: PREG TEST UR: NEGATIVE

## 2016-02-19 MED ORDER — LISINOPRIL 20 MG PO TABS
20.0000 mg | ORAL_TABLET | Freq: Once | ORAL | Status: AC
  Administered 2016-02-19: 20 mg via ORAL
  Filled 2016-02-19: qty 1

## 2016-02-19 MED ORDER — HYDRALAZINE HCL 20 MG/ML IJ SOLN
10.0000 mg | Freq: Once | INTRAMUSCULAR | Status: AC
  Administered 2016-02-19: 10 mg via INTRAVENOUS
  Filled 2016-02-19: qty 1

## 2016-02-19 MED ORDER — LABETALOL HCL 5 MG/ML IV SOLN: 20.0000 mg | Freq: Once | INTRAVENOUS | Status: DC

## 2016-02-19 MED ORDER — HYDRALAZINE HCL 10 MG PO TABS: 10.0000 mg | ORAL_TABLET | Freq: Once | ORAL | Status: DC

## 2016-02-19 NOTE — ED Provider Notes (Signed)
[facility] DEPT Provider Note   CSN: [id number] Arrival date & time: 02/19/16  1310     History   Chief Complaint Chief Complaint  Patient presents with  . Hypertension    HPI Jane Santiago is a 30 y.o. female.  The history is provided by the patient.  Hypertension  This is a chronic problem. The problem occurs daily. The problem has not changed since onset.Pertinent negatives include no chest pain, no abdominal pain, no headaches and no shortness of breath. Nothing aggravates the symptoms. The symptoms are relieved by medications. Treatments tried: lisinopril and metoprolol.   Was at work yesterday and noted leg swelling. BP noted to be 206/124 at work yesterday. No symptoms (ie. Chest pain, SOB, nausea, dizziness, weakness, change in UOP).  No recent infection or illnesses. States she is likely not pregnant as she is on the Mirena that should come out this year. LMP; 08/2010.  Here for clearance to go back to work. No other complaints.  Past Medical History:  Diagnosis Date  . Anemia   . Diabetes mellitus without complication ([place])   . Hypertension   . Tachycardia     Patient Active Problem List   Diagnosis Date Noted  . Multinodular goiter 06/08/2015  . Essential hypertension 05/19/2014  . Sepsis ([place]) 05/17/2014  . Bacteremia 05/15/2014  . Pyelonephritis 05/15/2014  . Tachycardia 05/14/2014  . Leukocytosis 05/14/2014  . Right flank pain 05/14/2014  . Anemia 05/14/2014  . Diabetes ([place]) 05/14/2014  . Diabetes in pregnancy ([place]) 04/08/2011    Past Surgical History:  Procedure Laterality Date  . CESAREAN SECTION     x2    OB History    Gravida Para Term Preterm AB Living   5 1 1  0 3 1   SAB TAB Ectopic Multiple Live Births   3 0 0 0         Home Medications    Prior to Admission medications   Medication Sig Start Date End Date Taking? Authorizing Provider  gabapentin (NEURONTIN) 300 MG capsule Take 1 capsule (300 mg total) by mouth at bedtime.  05/29/15  Yes Lance Bosch, NP  insulin lispro (HUMALOG) 100 UNIT/ML injection Inject 0.05 mLs (5 Units total) into the skin daily as needed. Patient taking differently: Inject 5 Units into the skin daily as needed for high blood sugar.  05/21/15  Yes Lance Bosch, NP  lisinopril (PRINIVIL,ZESTRIL) 20 MG tablet Take 1 tablet (20 mg total) by mouth daily. 05/21/15  Yes Lance Bosch, NP  metFORMIN (GLUCOPHAGE XR) 500 MG 24 hr tablet Take 2 tablets (1,000 mg total) by mouth 2 (two) times daily with a meal. 06/14/15  Yes Olugbemiga E Doreene Burke, MD  metoprolol (LOPRESSOR) 50 MG tablet Take 1 tablet (50 mg total) by mouth daily. 05/21/15  Yes Lance Bosch, NP  glucose blood (ACCU-CHEK AVIVA) test strip Use as instructed 05/22/15   Lance Bosch, NP  insulin glargine (LANTUS) 100 UNIT/ML injection Inject 0.3 mLs (30 Units total) into the skin at bedtime. 06/14/15   Tresa Garter, MD    Family History Family History  Problem Relation Age of Onset  . Thyroid disease Mother   . Diabetes Father     Social History Social History  Substance Use Topics  . Smoking status: Never Smoker  . Smokeless tobacco: Never Used  . Alcohol use No     Allergies   Review of patient's allergies indicates no known allergies.   Review of  Systems Review of Systems  Constitutional: Negative for appetite change, chills, fatigue and fever.  HENT: Negative for congestion, mouth sores and sore throat.   Eyes: Negative for visual disturbance.  Respiratory: Negative for cough, chest tightness and shortness of breath.   Cardiovascular: Negative for chest pain and palpitations.  Gastrointestinal: Negative for abdominal pain, blood in stool, diarrhea, nausea and vomiting.  Genitourinary: Negative for decreased urine volume, difficulty urinating and frequency.  Musculoskeletal: Negative for back pain and neck stiffness.  Skin: Negative for rash.  Neurological: Negative for dizziness, weakness,  light-headedness and headaches.  All other systems reviewed and are negative.    Physical Exam Updated Vital Signs BP (!) 173/111 (BP Location: Right Arm)   Pulse 100   Temp 98.1 F (36.7 C) (Oral)   Resp 18   Ht 5\' 1"  (1.549 m)   Wt 174 lb (78.9 kg)   SpO2 100%   BMI 32.88 kg/m   Physical Exam  Constitutional: She is oriented to person, place, and time. She appears well-developed and well-nourished. No distress.  HENT:  Head: Normocephalic and atraumatic.  Right Ear: External ear normal.  Left Ear: External ear normal.  Nose: Nose normal.  Eyes: Conjunctivae and EOM are normal. Pupils are equal, round, and reactive to light. Right eye exhibits no discharge. Left eye exhibits no discharge. No scleral icterus.  Neck: Normal range of motion. Neck supple.  Cardiovascular: Normal rate, regular rhythm and normal heart sounds.  Exam reveals no gallop and no friction rub.   No murmur heard. Pulmonary/Chest: Effort normal and breath sounds normal. No stridor. No respiratory distress. She has no wheezes.  Abdominal: Soft. She exhibits no distension. There is no tenderness.  Musculoskeletal: She exhibits no edema or tenderness.  Neurological: She is alert and oriented to person, place, and time.  Mental Status: Alert and oriented to person, place, and time. Attention and concentration normal. Speech clear. Recent memory is intac  Cranial Nerves  II Visual Fields: Intact to confrontation. Visual fields intact. III, IV, VI: Pupils equal and reactive to light and near. Full eye movement without nystagmus  V Facial Sensation: Normal. No weakness of masticatory muscles  VII: No facial weakness or asymmetry  VIII Auditory Acuity: Grossly normal  IX/X: The uvula is midline; the palate elevates symmetrically  XI: Normal sternocleidomastoid and trapezius strength  XII: The tongue is midline. No atrophy or fasciculations.   Motor System: Muscle Strength: 5/5 and symmetric in the upper and  lower extremities. No pronation or drift.  Muscle Tone: Tone and muscle bulk are normal in the upper and lower extremities.   Reflexes: DTRs: 2+ and symmetrical in all four extremities. Plantar responses are flexor bilaterally.  Coordination: Intact finger-to-nose, heel-to-shin, and rapid alternating movements. No tremor.  Sensation: Intact to light touch, and pinprick. Negative Romberg test.  Gait: Routine and tandem gait are normal    Skin: Skin is warm and dry. No rash noted. She is not diaphoretic. No erythema.  Psychiatric: She has a normal mood and affect.     ED Treatments / Results  Labs (all labs ordered are listed, but only abnormal results are displayed) Labs Reviewed  BASIC METABOLIC PANEL - Abnormal; Notable for the following:       Result Value   Glucose, Bld 356 (*)    All other components within normal limits  URINALYSIS, ROUTINE W REFLEX MICROSCOPIC (NOT AT [facility]) - Abnormal; Notable for the following:    APPearance CLOUDY (*)  Glucose, UA >1000 (*)    Hgb urine dipstick MODERATE (*)    Protein, ur >300 (*)    All other components within normal limits  URINE MICROSCOPIC-ADD ON - Abnormal; Notable for the following:    Squamous Epithelial / LPF 6-30 (*)    Bacteria, UA MANY (*)    All other components within normal limits  CBG MONITORING, ED - Abnormal; Notable for the following:    Glucose-Capillary 362 (*)    All other components within normal limits  CBC WITH DIFFERENTIAL/PLATELET  POC URINE PREG, ED    EKG  EKG Interpretation  Date/Time:  Tuesday February 19 2016 16:16:47 EDT Ventricular Rate:  96 PR Interval:    QRS Duration: 82 QT Interval:  337 QTC Calculation: 426 R Axis:   79 Text Interpretation:  Sinus rhythm No significant change since last tracing Confirmed by [facility] MD, ERIN ([id number]) on 02/19/2016 4:19:46 PM       Radiology No results found.  Procedures Procedures (including critical care time)  Medications Ordered in  ED Medications  lisinopril (PRINIVIL,ZESTRIL) tablet 20 mg (20 mg Oral Given 02/19/16 1805)  hydrALAZINE (APRESOLINE) injection 10 mg (10 mg Intravenous Given 02/19/16 1933)     Initial Impression / Assessment and Plan / ED Course  I have reviewed the triage vital signs and the nursing notes.  Pertinent labs & imaging results that were available during my care of the patient were reviewed by me and considered in my medical decision making (see chart for details).  Clinical Course    Asymptomatic HTN. BP 184/123 on my assessment. Repeated blood pressures continued to be elevated. She is provided with additional dose of her lisinopril.   No improvement.  Given IV hydralazine, which has significant improvement in her pressures. Workup did not reveal any evidence of end organ damage. This slows concern for hypertension emergency at this time.  Patient is safe for discharge with strict return precautions. Patient is to follow-up with her primary care provider for additional blood pressure readings and further management of her hypertension.  Vitals:   02/19/16 2025 02/19/16 2030 02/19/16 2100 02/19/16 2130  BP: (!) 162/107 (!) 161/103 146/91 132/88  Pulse: 104 103 104 95  Resp: 12 14 14 15   Temp:      TempSrc:      SpO2: 100% 100% 98% 99%  Weight:      Height:         Final Clinical Impressions(s) / ED Diagnoses   Final diagnoses:  Secondary hypertension, unspecified   Disposition: Discharge  Condition: Good  I have discussed the results, Dx and Tx plan with the patient who expressed understanding and agree(s) with the plan. Discharge instructions discussed at great length. The patient was given strict return precautions who verbalized understanding of the instructions. No further questions at time of discharge.    Discharge Medication List as of 02/19/2016  9:42 PM      Follow Up: Lance Bosch, NP  In 2 days For close follow up for further management of your high  blood pressure      Fatima Blank, MD 02/20/16 0140

## 2016-02-19 NOTE — ED Notes (Signed)
MD at bedside. 

## 2016-02-19 NOTE — ED Triage Notes (Signed)
Pt reports she had her BP checked while she was at work yesterday.  Here today to have clearance and make sure that she can go back to work.  Pt denies any h/a or dizziness at present but reports bila feet swelling.  Pt reports she takes lisinopril and metoprolol, was dx with PIH x 4 years.

## 2016-05-26 ENCOUNTER — Other Ambulatory Visit: Payer: Self-pay | Admitting: Pharmacist

## 2016-05-26 DIAGNOSIS — I10 Essential (primary) hypertension: Secondary | ICD-10-CM

## 2016-05-26 MED ORDER — METOPROLOL TARTRATE 50 MG PO TABS: 50.0000 mg | ORAL_TABLET | Freq: Every day | ORAL | 0 refills | Status: DC

## 2016-05-26 NOTE — Telephone Encounter (Signed)
Patient's pharmacy requested refills for metoprolol tartrate. Patient is prescribed it only once a day though it is typically prescribed twice daily. Will refill as ordered x 30 days but patient must have office visit for any further refills.

## 2016-06-01 ENCOUNTER — Other Ambulatory Visit: Payer: Self-pay | Admitting: Internal Medicine

## 2016-06-01 DIAGNOSIS — E119 Type 2 diabetes mellitus without complications: Secondary | ICD-10-CM

## 2016-06-01 DIAGNOSIS — I10 Essential (primary) hypertension: Secondary | ICD-10-CM

## 2016-06-18 ENCOUNTER — Encounter: Payer: Self-pay | Admitting: Family Medicine

## 2016-06-18 ENCOUNTER — Ambulatory Visit: Payer: Medicaid Other | Attending: Family Medicine | Admitting: Family Medicine

## 2016-06-18 ENCOUNTER — Emergency Department (HOSPITAL_COMMUNITY)
Admission: EM | Admit: 2016-06-18 | Discharge: 2016-06-18 | Disposition: A | Discharge status: Home / Self Care | Payer: No Typology Code available for payment source

## 2016-06-18 ENCOUNTER — Other Ambulatory Visit: Payer: Self-pay | Admitting: Family Medicine

## 2016-06-18 VITALS — BP 146/86 | HR 87 | Temp 98.7°F | Resp 18 | Ht 61.0 in | Wt 174.8 lb

## 2016-06-18 DIAGNOSIS — I10 Essential (primary) hypertension: Secondary | ICD-10-CM

## 2016-06-18 DIAGNOSIS — R3129 Other microscopic hematuria: Secondary | ICD-10-CM

## 2016-06-18 DIAGNOSIS — E1142 Type 2 diabetes mellitus with diabetic polyneuropathy: Secondary | ICD-10-CM

## 2016-06-18 DIAGNOSIS — E1165 Type 2 diabetes mellitus with hyperglycemia: Secondary | ICD-10-CM | POA: Insufficient documentation

## 2016-06-18 DIAGNOSIS — R739 Hyperglycemia, unspecified: Secondary | ICD-10-CM | POA: Diagnosis not present

## 2016-06-18 DIAGNOSIS — N3001 Acute cystitis with hematuria: Secondary | ICD-10-CM | POA: Diagnosis not present

## 2016-06-18 DIAGNOSIS — Z794 Long term (current) use of insulin: Secondary | ICD-10-CM

## 2016-06-18 DIAGNOSIS — Z0000 Encounter for general adult medical examination without abnormal findings: Secondary | ICD-10-CM

## 2016-06-18 DIAGNOSIS — R809 Proteinuria, unspecified: Secondary | ICD-10-CM

## 2016-06-18 DIAGNOSIS — E1169 Type 2 diabetes mellitus with other specified complication: Secondary | ICD-10-CM

## 2016-06-18 LAB — POCT URINALYSIS DIPSTICK
BILIRUBIN UA: NEGATIVE
GLUCOSE UA: 500
KETONES UA: NEGATIVE
LEUKOCYTES UA: NEGATIVE
Nitrite, UA: POSITIVE
Protein, UA: >=300
Spec Grav, UA: 1.015
Urobilinogen, UA: 0.2
pH, UA: 6

## 2016-06-18 LAB — COMPLETE METABOLIC PANEL WITH GFR
ALBUMIN: 3.1 g/dL — AB (ref 3.6–5.1)
ALT: 19 U/L (ref 6–29)
AST: 19 U/L (ref 10–30)
Alkaline Phosphatase: 61 U/L (ref 33–115)
BILIRUBIN TOTAL: 0.3 mg/dL (ref 0.2–1.2)
BUN: 14 mg/dL (ref 7–25)
CALCIUM: 9.1 mg/dL (ref 8.6–10.2)
CO2: 25 mmol/L (ref 20–31)
CREATININE: 1.09 mg/dL (ref 0.50–1.10)
Chloride: 104 mmol/L (ref 98–110)
GFR, EST AFRICAN AMERICAN: 79 mL/min (ref >=60)
GFR, Est Non African American: 68 mL/min (ref >=60)
Glucose, Bld: 241 mg/dL — ABNORMAL HIGH (ref 65–99)
Potassium: 4.1 mmol/L (ref 3.5–5.3)
Sodium: 136 mmol/L (ref 135–146)
TOTAL PROTEIN: 6.5 g/dL (ref 6.1–8.1)

## 2016-06-18 LAB — LIPID PANEL
CHOL/HDL RATIO: 4.4 ratio (ref <5.0)
CHOLESTEROL: 228 mg/dL — AB (ref <200)
HDL: 52 mg/dL (ref >50)
LDL Cholesterol: 152 mg/dL — ABNORMAL HIGH (ref <100)
Triglycerides: 120 mg/dL (ref <150)
VLDL: 24 mg/dL (ref <30)

## 2016-06-18 LAB — POCT GLYCOSYLATED HEMOGLOBIN (HGB A1C)

## 2016-06-18 MED ORDER — NITROFURANTOIN MONOHYD MACRO 100 MG PO CAPS: 100.0000 mg | ORAL_CAPSULE | Freq: Two times a day (BID) | ORAL | 0 refills | Status: AC

## 2016-06-18 MED ORDER — INSULIN LISPRO 100 UNIT/ML ~~LOC~~ SOLN: 5.0000 [IU] | Freq: Every day | SUBCUTANEOUS | 2 refills | Status: DC | PRN

## 2016-06-18 MED ORDER — INSULIN REGULAR HUMAN 100 UNIT/ML IJ SOLN
20.0000 [IU] | Freq: Once | INTRAMUSCULAR | Status: AC
  Administered 2016-06-18: 20 [IU] via SUBCUTANEOUS

## 2016-06-18 MED ORDER — INSULIN GLARGINE 100 UNIT/ML ~~LOC~~ SOLN: 30.0000 [IU] | Freq: Every day | SUBCUTANEOUS | 11 refills | Status: DC

## 2016-06-18 MED ORDER — METFORMIN HCL ER 500 MG PO TB24: 1000.0000 mg | ORAL_TABLET | Freq: Two times a day (BID) | ORAL | 2 refills | Status: DC

## 2016-06-18 MED ORDER — METOPROLOL TARTRATE 50 MG PO TABS: 50.0000 mg | ORAL_TABLET | Freq: Every day | ORAL | 2 refills | Status: DC

## 2016-06-18 MED ORDER — GLUCOSE BLOOD VI STRP: ORAL_STRIP | 12 refills | Status: DC

## 2016-06-18 MED ORDER — LISINOPRIL 20 MG PO TABS: 20.0000 mg | ORAL_TABLET | Freq: Every day | ORAL | 2 refills | Status: DC

## 2016-06-18 MED ORDER — GABAPENTIN 300 MG PO CAPS: 300.0000 mg | ORAL_CAPSULE | Freq: Every day | ORAL | 2 refills | Status: DC

## 2016-06-18 MED FILL — LISINOPRIL 20 MG TABLET: 20 | 30 days supply | Qty: 30 | Fill #0

## 2016-06-18 MED FILL — LANTUS 100 UNITS/ML VIAL: 100 | 30 days supply | Qty: 10 | Fill #0

## 2016-06-18 MED FILL — NITROFURANTOIN MONO-MCR 100: 100 | 7 days supply | Qty: 14 | Fill #0

## 2016-06-18 MED FILL — METFORMIN HCL ER 500 MG TAB: 500 | 30 days supply | Qty: 120 | Fill #0

## 2016-06-18 MED FILL — HumaLOG 100 UNIT/ML SOLN: 100 | 30 days supply | Qty: 10 | Fill #0

## 2016-06-18 MED FILL — ACCU-CHEK AVIVA PLUS TEST S: 25 days supply | Qty: 100 | Fill #0

## 2016-06-18 MED FILL — GABAPENTIN 300 MG CAPSULE: 300 | 30 days supply | Qty: 30 | Fill #0

## 2016-06-18 MED FILL — METOPROLOL TARTRATE 50 MG T: 50 | 30 days supply | Qty: 30 | Fill #0

## 2016-06-18 NOTE — Progress Notes (Signed)
Subjective:  Patient ID: Jane Santiago, female    DOB: Feb 07, 1986  Age: 30 y.o. MRN: [id number]  CC: Hypertension and Diabetes   HPI Jane Santiago comes to the office for hypertension and diabetes. She reports being without her medications for 4 to 5 months prior due to loss of job and health insurance coverage. She denies any CP, SOB, or swelling of the extremities. She denies any lightheadedness or dizziness. She denies any polyuria, polyphagia, or polydipsia. She denies any tingling or numbness of the extremities but reports stabbing sensation of bilateral feet on occasion. She denies any skin issues.  Outpatient Medications Prior to Visit  Medication Sig Dispense Refill  . gabapentin (NEURONTIN) 300 MG capsule Take 1 capsule (300 mg total) by mouth at bedtime. 30 capsule 3  . glucose blood (ACCU-CHEK AVIVA) test strip Use as instructed 100 each 12  . HUMALOG 100 UNIT/ML injection INJECT 5 UNITS INTO THE SKIN DAILY AS NEEDED 10 mL 0  . insulin glargine (LANTUS) 100 UNIT/ML injection Inject 0.3 mLs (30 Units total) into the skin at bedtime. 10 mL 11  . lisinopril (PRINIVIL,ZESTRIL) 20 MG tablet TAKE 1 TABLET BY MOUTH EVERY DAY 30 tablet 0  . metFORMIN (GLUCOPHAGE XR) 500 MG 24 hr tablet Take 2 tablets (1,000 mg total) by mouth 2 (two) times daily with a meal. 120 tablet 3  . metoprolol (LOPRESSOR) 50 MG tablet Take 1 tablet (50 mg total) by mouth daily. 30 tablet 0   No facility-administered medications prior to visit.     ROS Review of Systems  Respiratory: Negative.   Cardiovascular: Negative.   Gastrointestinal: Negative.   Endocrine: Negative for polydipsia, polyphagia and polyuria.  Skin: Negative.   Neurological: Negative.     Objective:  BP (!) 146/86 (BP Location: Right Arm, Patient Position: Sitting, Cuff Size: Normal)   Pulse 87   Temp 98.7 F (37.1 C) (Oral)   Resp 18   Ht 5\' 1"  (1.549 m)   Wt 174 lb 12.8 oz (79.3 kg)   SpO2 100%   BMI 33.03 kg/m   BP/Weight  06/18/2016 02/19/2016 64/33/2951  Systolic BP 884 166 063  Diastolic BP 86 88 88  Wt. (Lbs) 174.8 174 179  BMI 33.03 32.88 33.84    Physical Exam  Constitutional: She is oriented to person, place, and time. She appears well-developed and well-nourished.  Eyes: Conjunctivae and EOM are normal. Pupils are equal, round, and reactive to light. Right eye exhibits no discharge. Left eye exhibits no discharge. No scleral icterus.  Neck: Normal range of motion. JVD present.  Cardiovascular: Normal rate, regular rhythm, normal heart sounds and intact distal pulses.   Pulmonary/Chest: Effort normal and breath sounds normal.  Abdominal: Soft. Bowel sounds are normal. She exhibits no mass. There is no tenderness.  Neurological: She is alert and oriented to person, place, and time.  Skin: Skin is warm and dry.  Monofilament foot exam is wnl.  Psychiatric: She has a normal mood and affect. Her behavior is normal. Thought content normal.     Assessment & Plan:   1. Essential hypertension - lisinopril (PRINIVIL,ZESTRIL) 20 MG tablet; Take 1 tablet (20 mg total) by mouth daily.  Dispense: 30 tablet; Refill: 2 - metoprolol (LOPRESSOR) 50 MG tablet; Take 1 tablet (50 mg total) by mouth daily.  Dispense: 30 tablet; Refill: 2  2. Type 2 diabetes mellitus with other specified complication, with long-term current use of insulin (HCC) - HgB A1c - Urinalysis Dipstick -  Microalbumin / creatinine urine ratio - glucose blood (ACCU-CHEK AVIVA) test strip; Use as instructed  Dispense: 100 each; Refill: 12 - insulin glargine (LANTUS) 100 UNIT/ML injection; Inject 0.3 mLs (30 Units total) into the skin at bedtime.  Dispense: 10 mL; Refill: 11 - metFORMIN (GLUCOPHAGE XR) 500 MG 24 hr tablet; Take 2 tablets (1,000 mg total) by mouth 2 (two) times daily with a meal.  Dispense: 120 tablet; Refill: 2 - Lipid Panel - insulin lispro (HUMALOG) 100 UNIT/ML injection; Inject 0.05 mLs (5 Units total) into the skin daily as  needed for high blood sugar (Take with largest meal.).  Dispense: 10 mL; Refill: 2  3. Diabetic polyneuropathy associated with type 2 diabetes mellitus (HCC) - gabapentin (NEURONTIN) 300 MG capsule; Take 1 capsule (300 mg total) by mouth at bedtime.  Dispense: 30 capsule; Refill: 2  4. Hyperglycemia - Urinalysis Dipstick - insulin regular (NOVOLIN R,HUMULIN R) 250 units/2.69mL (100 units/mL) injection 20 Units; Inject 0.2 mLs (20 Units total) into the skin once. 5. Acute cystitis with hematuria - Urine culture - nitrofurantoin, macrocrystal-monohydrate, (MACROBID) 100 MG capsule; Take 1 capsule (100 mg total) by mouth 2 (two) times daily.  Dispense: 14 capsule; Refill: 0  6. Proteinuria, unspecified type - COMPLETE METABOLIC PANEL WITH GFR  7. Other microscopic hematuria -Urine culture  8. Healthcare maintenance - HIV antibody (with reflex)  Meds ordered this encounter  Medications  . insulin regular (NOVOLIN R,HUMULIN R) 250 units/2.88mL (100 units/mL) injection 20 Units  . glucose blood (ACCU-CHEK AVIVA) test strip    Sig: Use as instructed    Dispense:  100 each    Refill:  12    Order Specific Question:   Supervising Provider    Answer:   Tresa Garter [[id number]]  . insulin glargine (LANTUS) 100 UNIT/ML injection    Sig: Inject 0.3 mLs (30 Units total) into the skin at bedtime.    Dispense:  10 mL    Refill:  11    Order Specific Question:   Supervising Provider    Answer:   Tresa Garter [id number]  . lisinopril (PRINIVIL,ZESTRIL) 20 MG tablet    Sig: Take 1 tablet (20 mg total) by mouth daily.    Dispense:  30 tablet    Refill:  2    Order Specific Question:   Supervising Provider    Answer:   Tresa Garter [id number]  . metFORMIN (GLUCOPHAGE XR) 500 MG 24 hr tablet    Sig: Take 2 tablets (1,000 mg total) by mouth 2 (two) times daily with a meal.    Dispense:  120 tablet    Refill:  2    Order Specific Question:   Supervising Provider    Answer:    Tresa Garter [id number]  . metoprolol (LOPRESSOR) 50 MG tablet    Sig: Take 1 tablet (50 mg total) by mouth daily.    Dispense:  30 tablet    Refill:  2    Order Specific Question:   Supervising Provider    Answer:   Tresa Garter [id number]  . gabapentin (NEURONTIN) 300 MG capsule    Sig: Take 1 capsule (300 mg total) by mouth at bedtime.    Dispense:  30 capsule    Refill:  2    Order Specific Question:   Supervising Provider    Answer:   Tresa Garter [id number]  . insulin lispro (HUMALOG) 100 UNIT/ML injection    Sig: Inject 0.05 mLs (5 Units total)  into the skin daily as needed for high blood sugar (Take with largest meal.).    Dispense:  10 mL    Refill:  2    Order Specific Question:   Supervising Provider    Answer:   Tresa Garter [id number]  . nitrofurantoin, macrocrystal-monohydrate, (MACROBID) 100 MG capsule    Sig: Take 1 capsule (100 mg total) by mouth 2 (two) times daily.    Dispense:  14 capsule    Refill:  0    Order Specific Question:   Supervising Provider    Answer:   Tresa Garter [[id number]]    Follow-up: Return in about 3 months (around 09/16/2016) for Diabetes & Hypertension.   Alfonse Spruce FNP

## 2016-06-18 NOTE — Patient Instructions (Signed)
Urinary Tract Infection, Adult Introduction A urinary tract infection (UTI) is an infection of any part of the urinary tract. The urinary tract includes the:  Kidneys.  Ureters.  Bladder.  Urethra. These organs make, store, and get rid of pee (urine) in the body. Follow these instructions at home:  Take over-the-counter and prescription medicines only as told by your doctor.  If you were prescribed an antibiotic medicine, take it as told by your doctor. Do not stop taking the antibiotic even if you start to feel better.  Avoid the following drinks:  Alcohol.  Caffeine.  Tea.  Carbonated drinks.  Drink enough fluid to keep your pee clear or pale yellow.  Keep all follow-up visits as told by your doctor. This is important.  Make sure to:  Empty your bladder often and completely. Do not to hold pee for long periods of time.  Empty your bladder before and after sex.  Wipe from front to back after a bowel movement if you are female. Use each tissue one time when you wipe. Contact a doctor if:  You have back pain.  You have a fever.  You feel sick to your stomach (nauseous).  You throw up (vomit).  Your symptoms do not get better after 3 days.  Your symptoms go away and then come back. Get help right away if:  You have very bad back pain.  You have very bad lower belly (abdominal) pain.  You are throwing up and cannot keep down any medicines or water. This information is not intended to replace advice given to you by your health care provider. Make sure you discuss any questions you have with your health care provider. Document Released: 12/10/2007 Document Revised: 11/29/2015 Document Reviewed: 05/14/2015  2017 Elsevier Hypertension Hypertension is another name for high blood pressure. High blood pressure forces your heart to work harder to pump blood. A blood pressure reading has two numbers, which includes a higher number over a lower number (example:  110/72). Follow these instructions at home:  Have your blood pressure rechecked by your doctor.  Only take medicine as told by your doctor. Follow the directions carefully. The medicine does not work as well if you skip doses. Skipping doses also puts you at risk for problems.  Do not smoke.  Monitor your blood pressure at home as told by your doctor. Contact a doctor if:  You think you are having a reaction to the medicine you are taking.  You have repeat headaches or feel dizzy.  You have puffiness (swelling) in your ankles.  You have trouble with your vision. Get help right away if:  You get a very bad headache and are confused.  You feel weak, numb, or faint.  You get chest or belly (abdominal) pain.  You throw up (vomit).  You cannot breathe very well. This information is not intended to replace advice given to you by your health care provider. Make sure you discuss any questions you have with your health care provider. Document Released: 12/10/2007 Document Revised: 11/29/2015 Document Reviewed: 04/15/2013 Elsevier Interactive Patient Education  2017 [place].  Type 2 Diabetes Mellitus, Self Care, Adult When you have type 2 diabetes (type 2 diabetes mellitus), you must keep your blood sugar (glucose) under control. You can do this with:  Nutrition.  Exercise.  Lifestyle changes.  Medicines or insulin, if needed.  Support from your doctors and others. How do I manage my blood sugar?  Check your blood sugar level every day,  as often as told.  Call your doctor if your blood sugar is above your goal numbers for 2 tests in a row.  Have your A1c (hemoglobin A1c) level checked at least two times a year. Have it checked more often if your doctor tells you to. Your doctor will set treatment goals for you. Generally, you should have these blood sugar levels:  Before meals (preprandial): 80-130 mg/dL (4.4-7.2 mmol/L).  After meals (postprandial): lower than  180 mg/dL (10 mmol/L).  A1c level: less than 7%. What do I need to know about high blood sugar? High blood sugar is called hyperglycemia. Know the signs of high blood sugar. Signs may include:  Feeling:  Thirsty.  Hungry.  Very tired.  Needing to pee (urinate) more than usual.  Blurry vision. What do I need to know about low blood sugar? Low blood sugar is called hypoglycemia. This is when blood sugar is at or below 70 mg/dL (3.9 mmol/L). Symptoms may include:  Feeling:  Hungry.  Worried or nervous (anxious).  Sweaty and clammy.  Confused.  Dizzy.  Sleepy.  Sick to your stomach (nauseous).  Having:  A fast heartbeat (palpitations).  A headache.  A change in your vision.  Jerky movements that you cannot control (seizure).  Nightmares.  Tingling or no feeling (numbness) around the mouth, lips, or tongue.  Having trouble with:  Talking.  Paying attention (concentrating).  Moving (coordination).  Sleeping.  Shaking.  Passing out (fainting).  Getting upset easily (irritability). Treating low blood sugar  To treat low blood sugar, eat or drink something sugary right away. If you can think clearly and swallow safely, follow the 15:15 rule:  Take 15 grams of a fast-acting carb (carbohydrate). Some fast-acting carbs are:  1 tube of glucose gel.  3 sugar tablets (glucose pills).  6-8 pieces of hard candy.  4 oz (120 mL) of fruit juice.  4 oz (120 mL) regular (not diet) soda.  Check your blood sugar 15 minutes after you take the carb.  If your blood sugar is still at or below 70 mg/dL (3.9 mmol/L), take 15 grams of a carb again.  If your blood sugar does not go above 70 mg/dL (3.9 mmol/L) after 3 tries, get help right away.  After your blood sugar goes back to normal, eat a meal or a snack within 1 hour. Treating very low blood sugar  If your blood sugar is at or below 54 mg/dL (3 mmol/L), you have very low blood sugar (severe  hypoglycemia). This is an emergency. Do not wait to see if the symptoms will go away. Get medical help right away. Call your local emergency services (911 in the [place] Do not drive yourself to the hospital. If you have very low blood sugar and you cannot eat or drink, you may need a glucagon shot (injection). A family member or friend should learn how to check your blood sugar and how to give you a glucagon shot. Ask your doctor if you need to have a glucagon shot kit at home. What else is important to manage my diabetes? Medicine  Follow these instructions about insulin and diabetes medicines:  Take them as told by your doctor.  Adjust them as told by your doctor.  Do not run out of them. Having diabetes can raise your risk for other long-term conditions. These include heart or kidney disease. Your doctor may prescribe medicines to help prevent problems from diabetes. Food   Make healthy food choices. These include:  Chicken, fish, egg whites, and beans.  Oats, whole wheat, bulgur, brown rice, quinoa, and millet.  Fresh fruits and vegetables.  Low-fat dairy products.  Nuts, avocado, olive oil, and canola oil.  Make a food plan with a specialist (dietitian).  Follow instructions from your doctor about what you cannot eat or drink.  Drink enough fluid to keep your pee (urine) clear or pale yellow.  Eat healthy snacks between healthy meals.  Keep track of carbs that you eat. Read food labels. Learn food serving sizes.  Follow your sick day plan when you cannot eat or drink normally. Make this plan with your doctor so it is ready to use. Activity  Exercise at least 3 times a week.  Do not go more than 2 days without exercising.  Talk with your doctor before you start a new exercise. Your doctor may need to adjust your insulin, medicines, or food. Lifestyle   Do not use any tobacco products. These include cigarettes, chewing tobacco, and e-cigarettes.If you need help  quitting, ask your doctor.  Ask your doctor how much alcohol is safe for you.  Learn to deal with stress. If you need help with this, ask your doctor. Body care  Stay up to date with your shots (immunizations).  Have your eyes and feet checked by a doctor as often as told.  Check your skin and feet every day. Check for cuts, bruises, redness, blisters, or sores.  Brush your teeth and gums two times a day.  Floss at least one time a day.  Go to the dentist least one time every 6 months.  Stay at a healthy weight. General instructions   Take over-the-counter and prescription medicines only as told by your doctor.  Share your diabetes care plan with:  Your work or school.  People you live with.  Check your pee (urine) for ketones:  When you are sick.  As told by your doctor.  Carry a card or wear jewelry that says that you have diabetes.  Ask your doctor:  Do I need to meet with a diabetes educator?  Where can I find a support group for people with diabetes?  Keep all follow-up visits as told by your doctor. This is important. Where to find more information: To learn more about diabetes, visit:  American Diabetes Association: www.diabetes.org  American Association of Diabetes Educators: www.diabeteseducator.org/patient-resources This information is not intended to replace advice given to you by your health care provider. Make sure you discuss any questions you have with your health care provider. Document Released: 10/15/2015 Document Revised: 11/29/2015 Document Reviewed: 07/27/2015 Elsevier Interactive Patient Education  2017 [employer].

## 2016-06-18 NOTE — Progress Notes (Signed)
Patient is here to re-establish care  Patient has taken medication today. Patient has eaten today.  Patient tolerated 20 units well today.

## 2016-06-19 LAB — URINE CULTURE

## 2016-06-19 LAB — MICROALBUMIN / CREATININE URINE RATIO
Creatinine, Urine: 57 mg/dL (ref 20–320)
MICROALB/CREAT RATIO: 1374 ug/mg{creat} — AB (ref <30)
Microalb, Ur: 78.3 mg/dL

## 2016-06-19 LAB — HIV ANTIBODY (ROUTINE TESTING W REFLEX): HIV 1&2 Ab, 4th Generation: NONREACTIVE

## 2016-06-20 ENCOUNTER — Telehealth: Payer: Self-pay | Admitting: *Deleted

## 2016-06-20 NOTE — Telephone Encounter (Signed)
-----   Message from Alfonse Spruce, [place] sent at 06/20/2016  2:23 PM EST ----- -Urine culture was positive for bacteria which indicates urinary tract infection. Continue to take nitrofurantoin (Macrobid).   -HIV negative. -Microalbumin/creatinine ratio level was high. This tests for protein in your urine that can indicate early signs of kidney damage. Continue to take Lisinopril. Will recheck levels again in 3 months.

## 2016-06-20 NOTE — Telephone Encounter (Signed)
Medical Assistant left message on patient's home and cell voicemail. Voicemail states to give a call back to [place] with [facility] at [phone]. Patient is aware of UTI being present and to take Macrobid as prescribed. Patient is also aware of micro/albumin being high and this showing protein leakage which indicates early kidney disease.  Patient advised to continue taking lisinopril and a recheck will be completed in 3 months.

## 2016-08-07 MED FILL — LISINOPRIL 20 MG TABLET: 20 | 30 days supply | Qty: 30 | Fill #1

## 2016-08-07 MED FILL — METOPROLOL TARTRATE 50 MG T: 50 | 30 days supply | Qty: 30 | Fill #1

## 2016-09-16 ENCOUNTER — Other Ambulatory Visit: Payer: Self-pay | Admitting: Internal Medicine

## 2016-09-16 DIAGNOSIS — I10 Essential (primary) hypertension: Secondary | ICD-10-CM

## 2016-10-19 ENCOUNTER — Other Ambulatory Visit: Payer: Self-pay | Admitting: Family Medicine

## 2016-10-19 DIAGNOSIS — I10 Essential (primary) hypertension: Secondary | ICD-10-CM

## 2016-10-20 NOTE — Telephone Encounter (Signed)
CMA call to let know the patient that the provider sent her medication for a 30 day supply & that she needs to make a f/up appt   Patient did not answer but CMA left a detailed message

## 2016-10-20 NOTE — Telephone Encounter (Signed)
Refilled for 30 day supply only. Please let patient know its important that she schedule follow up appointment for diabetes / HTN.

## 2016-10-29 ENCOUNTER — Ambulatory Visit: Payer: Medicaid Other | Admitting: *Deleted

## 2016-11-19 ENCOUNTER — Ambulatory Visit: Payer: Medicaid Other | Admitting: *Deleted

## 2016-11-26 ENCOUNTER — Other Ambulatory Visit: Payer: Self-pay | Admitting: Family Medicine

## 2016-11-26 DIAGNOSIS — I10 Essential (primary) hypertension: Secondary | ICD-10-CM

## 2017-02-26 ENCOUNTER — Telehealth: Payer: Self-pay | Admitting: Cardiovascular Disease

## 2017-02-26 NOTE — Telephone Encounter (Signed)
Received records from [facility] for appointment on 03/12/17 with Dr Oval Linsey.  Records put with Dr Blenda Mounts schedule for 03/12/17. lp

## 2017-03-12 ENCOUNTER — Encounter: Payer: Self-pay | Admitting: *Deleted

## 2017-03-12 ENCOUNTER — Encounter: Payer: Self-pay | Admitting: Cardiovascular Disease

## 2017-03-12 ENCOUNTER — Ambulatory Visit (INDEPENDENT_AMBULATORY_CARE_PROVIDER_SITE_OTHER): Payer: Self-pay | Admitting: Cardiovascular Disease

## 2017-03-12 VITALS — BP 134/92 | HR 98 | Ht 61.0 in | Wt 180.1 lb

## 2017-03-12 DIAGNOSIS — E010 Iodine-deficiency related diffuse (endemic) goiter: Secondary | ICD-10-CM

## 2017-03-12 DIAGNOSIS — R600 Localized edema: Secondary | ICD-10-CM

## 2017-03-12 DIAGNOSIS — I10 Essential (primary) hypertension: Secondary | ICD-10-CM

## 2017-03-12 LAB — T4, FREE: Free T4: 1.32 ng/dL (ref 0.82–1.77)

## 2017-03-12 LAB — TSH: TSH: 1.4 u[IU]/mL (ref 0.450–4.500)

## 2017-03-12 NOTE — Progress Notes (Signed)
Cardiology Office Note   Date:  03/12/2017   ID:  Jane Santiago, DOB 13-Feb-1986, MRN [id number]  PCP:  Jane Spruce, FNP  Cardiologist:   Jane Latch, MD   No chief complaint on file.    History of Present Illness: Jane Santiago is a 31 y.o. female with diabetes and hypertension who presents for an evaluation edema. She was initially seen by Jane Crocker, NP and noted to have lower extremity edema. She was referred to cardiology for further evaluation.  She Santiago that for the last 3 months she has been experiencing lower extremity edema. It comes and goes throughout the day. It typically is worse at the end of the day and improves with elevation of her legs. She denies any shortness of breath, orthopnea, or PND. She initially group followed up with her primary care and was started on hydrochlorothiazide, which has helped somewhat. 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 She does limit the salt in her diet. Jane Santiago was initially diagnosed with hypertension in 2012 at the time of her pregnancy. She has been diabetic since age 89. Recently she has been working to improve her glucose control. AB/PAS it was poorly-controlled. She exercises for 20 minutes daily and has no exertional symptoms.  Jane Santiago hot/cold intolerance and diarrhea.  Her appetite has been poor.  She notes increased acid reflux and has been taking Prilosec.   Past Medical History:  Diagnosis Date  . Anemia   . Diabetes mellitus without complication ([place])   . Hypertension   . Tachycardia     Past Surgical History:  Procedure Laterality Date  . CESAREAN SECTION     x2     Current Outpatient Prescriptions  Medication Sig Dispense Refill  . Cholecalciferol (VITAMIN D3) 2000 units TABS Take 50 mcg by mouth daily.    Marland Kitchen glucose blood (ACCU-CHEK AVIVA) test strip Use as instructed 100 each 12  . hydrochlorothiazide (HYDRODIURIL) 25 MG tablet Take 1 tablet by mouth daily.    . Insulin  Glargine-Lixisenatide (SOLIQUA) 100-33 UNT-MCG/ML SOPN Inject 20 Units into the skin daily.    Marland Kitchen levonorgestrel (MIRENA) 20 MCG/24HR IUD 1 each by Intrauterine route once.    Marland Kitchen lisinopril (PRINIVIL,ZESTRIL) 20 MG tablet TAKE 1 TABLET BY MOUTH DAILY 30 tablet 0  . metFORMIN (GLUCOPHAGE XR) 500 MG 24 hr tablet Take 2 tablets (1,000 mg total) by mouth 2 (two) times daily with a meal. 120 tablet 2  . omeprazole (PRILOSEC) 10 MG capsule Take 10 mg by mouth daily.     No current facility-administered medications for this visit.     Allergies:   Patient has no known allergies.    Social History:  The patient  Santiago that she has never smoked. She has never used smokeless tobacco. She Santiago that she does not drink alcohol or use drugs.   Family History:  The patient's family history includes Cervical cancer in her mother; Diabetes in her father; Hypertension in her brother and sister; Thyroid disease in her mother; Valvular heart disease in her child.    ROS:  Please see the history of present illness.   Otherwise, review of systems are positive for none.   All other systems are reviewed and negative.    PHYSICAL EXAM: VS:  BP (!) 134/92 (BP Location: Left Arm, Patient Position: Sitting, Cuff Size: Normal)   Pulse 98   Ht 5\' 1"  (1.549 m)   Wt 81.7  kg (180 lb 1.6 oz)   BMI 34.03 kg/m  , BMI Body mass index is 34.03 kg/m. GENERAL:  Well appearing HEENT:  Pupils equal round and reactive, fundi not visualized, oral mucosa unremarkable NECK:  No jugular venous distention, waveform within normal limits, carotid upstroke brisk and symmetric, no bruits, + thyromegaly LYMPHATICS:  No cervical adenopathy LUNGS:  Clear to auscultation bilaterally.  No crackles, wheezes or rhonci HEART:  RRR.  PMI not displaced or sustained,S1 and S2 within normal limits, no S3, no S4, no clicks, no rubs, no murmurs ABD:  Flat, positive bowel sounds normal in frequency in pitch, no bruits, no rebound, no guarding,  no midline pulsatile mass, no hepatomegaly, no splenomegaly EXT:  2 plus pulses throughout, 2+ pitting edema to mid tibia bilaterally L>R, no cyanosis no clubbing SKIN:  No rashes no nodules NEURO:  Cranial nerves II through XII grossly intact, motor grossly intact throughout PSYCH:  Cognitively intact, oriented to person place and time    EKG:  EKG is ordered today. The ekg ordered today demonstrates sinus rhythm.  Rate 98 bpm.   E/3/18: Sinus rhythm. Rate 91 bpm.  Recent Labs: 06/18/2016: ALT 19; BUN 14; Creat 1.09; Potassium 4.1; Sodium 136    Lipid Panel    Component Value Date/Time   CHOL 228 (H) 06/18/2016 1225   TRIG 120 06/18/2016 1225   HDL 52 06/18/2016 1225   CHOLHDL 4.4 06/18/2016 1225   VLDL 24 06/18/2016 1225   LDLCALC 152 (H) 06/18/2016 1225   02/19/17: Total cholesterol 2:15, triglycerides 88, HDL 57, LDL 140 Hemoglobin A1c 8.2% Sodium 143, potassium 4.7, BUN 17, creatinine 1.3    Wt Readings from Last 3 Encounters:  03/12/17 81.7 kg (180 lb 1.6 oz)  06/18/16 79.3 kg (174 lb 12.8 oz)  02/19/16 78.9 kg (174 lb)      ASSESSMENT AND PLAN:  # LE Edema:  Jane Santiago's edema seems Most consistent with venous insufficiency. I suspect that this is because she spends most of her days on her feet. He has no symptoms consistent with heart failure and does not look volume overloaded on exam. Her son does have a history of congenital heart disease.  We will get an echo to better assess. I recommended that she wear compression stockings on at work. Also she should elevate her legs when sitting and limit her salt intake.  # Thyromegaly: Jane Santiago has hot/cold intolerance, diarrhea and loss of appetite.  She has thyromegaly without any evidence of thyroid nodules on exam. We will check a TSH and free T4.  # Hypertension: Continue lisinopril and HCTZ.    Current medicines are reviewed at length with the patient today.  The patient does not have concerns regarding  medicines.  The following changes have been made:  no change  Labs/ tests ordered today include:   Orders Placed This Encounter  Procedures  . T4, free  . TSH  . EKG 12-Lead  . ECHOCARDIOGRAM COMPLETE     Disposition:   FU with Laparis Durrett C. Oval Linsey, MD, [facility] in 2 months.     This note was written with the assistance of speech recognition software.  Please excuse any transcriptional errors.  Signed, Wren Pryce C. Oval Linsey, MD, [facility]  03/12/2017 11:14 AM    [place]

## 2017-03-12 NOTE — Patient Instructions (Signed)
Medication Instructions:  Your physician recommends that you continue on your current medications as directed. Please refer to the Current Medication list given to you today  Labwork: TSH/FT TODAY   Testing/Procedures: Your physician has requested that you have an echocardiogram. Echocardiography is a painless test that uses sound waves to create images of your heart. It provides your doctor with information about the size and shape of your heart and how well your heart's chambers and valves are working. This procedure takes approximately one hour. There are no restrictions for this procedure. [place] [address]  Follow-Up: Your physician recommends that you schedule a follow-up appointment in: 2 MONTH OV  Any Other Special Instructions Will Be Listed Below (If Applicable).  Echocardiogram An echocardiogram, or echocardiography, uses sound waves (ultrasound) to produce an image of your heart. The echocardiogram is simple, painless, obtained within a short period of time, and offers valuable information to your health care provider. The images from an echocardiogram can provide information such as:  Evidence of coronary artery disease (CAD).  Heart size.  Heart muscle function.  Heart valve function.  Aneurysm detection.  Evidence of a past heart attack.  Fluid buildup around the heart.  Heart muscle thickening.  Assess heart valve function.  Tell a health care provider about:  Any allergies you have.  All medicines you are taking, including vitamins, herbs, eye drops, creams, and over-the-counter medicines.  Any problems you or family members have had with anesthetic medicines.  Any blood disorders you have.  Any surgeries you have had.  Any medical conditions you have.  Whether you are pregnant or may be pregnant. What happens before the procedure? No special preparation is needed. Eat and drink normally. What happens during the  procedure?  In order to produce an image of your heart, gel will be applied to your chest and a wand-like tool (transducer) will be moved over your chest. The gel will help transmit the sound waves from the transducer. The sound waves will harmlessly bounce off your heart to allow the heart images to be captured in real-time motion. These images will then be recorded.  You may need an IV to receive a medicine that improves the quality of the pictures. What happens after the procedure? You may return to your normal schedule including diet, activities, and medicines, unless your health care provider tells you otherwise. This information is not intended to replace advice given to you by your health care provider. Make sure you discuss any questions you have with your health care provider. Document Released: 06/20/2000 Document Revised: 02/09/2016 Document Reviewed: 02/28/2013 Elsevier Interactive Patient Education  2017 [employer].

## 2017-03-16 ENCOUNTER — Other Ambulatory Visit: Payer: Self-pay

## 2017-03-16 ENCOUNTER — Ambulatory Visit (HOSPITAL_COMMUNITY): Payer: Self-pay | Attending: Cardiovascular Disease

## 2017-03-16 DIAGNOSIS — R600 Localized edema: Secondary | ICD-10-CM | POA: Insufficient documentation

## 2017-03-16 DIAGNOSIS — E119 Type 2 diabetes mellitus without complications: Secondary | ICD-10-CM | POA: Insufficient documentation

## 2017-03-16 DIAGNOSIS — I119 Hypertensive heart disease without heart failure: Secondary | ICD-10-CM | POA: Insufficient documentation

## 2017-03-20 ENCOUNTER — Telehealth: Payer: Self-pay | Admitting: Cardiovascular Disease

## 2017-03-20 NOTE — Telephone Encounter (Signed)
-----   Message from Skeet Latch, MD sent at 03/16/2017 10:04 AM EDT ----- Normal echo.  Her swelling is not coming from her heart.

## 2017-03-20 NOTE — Telephone Encounter (Signed)
F/u Message ° °Pt returning RN call. Please call back to discuss  °

## 2017-03-20 NOTE — Telephone Encounter (Signed)
Advised patient of Echo results  

## 2017-05-13 ENCOUNTER — Ambulatory Visit: Payer: Medicaid Other | Admitting: Cardiovascular Disease

## 2017-12-29 ENCOUNTER — Other Ambulatory Visit (HOSPITAL_COMMUNITY): Payer: Self-pay | Admitting: Nephrology

## 2017-12-29 DIAGNOSIS — IMO0002 Reserved for concepts with insufficient information to code with codable children: Secondary | ICD-10-CM

## 2017-12-29 DIAGNOSIS — E1165 Type 2 diabetes mellitus with hyperglycemia: Secondary | ICD-10-CM

## 2017-12-29 DIAGNOSIS — R809 Proteinuria, unspecified: Secondary | ICD-10-CM

## 2017-12-29 DIAGNOSIS — E1129 Type 2 diabetes mellitus with other diabetic kidney complication: Secondary | ICD-10-CM

## 2017-12-29 DIAGNOSIS — D631 Anemia in chronic kidney disease: Secondary | ICD-10-CM

## 2017-12-29 DIAGNOSIS — I129 Hypertensive chronic kidney disease with stage 1 through stage 4 chronic kidney disease, or unspecified chronic kidney disease: Secondary | ICD-10-CM

## 2017-12-29 DIAGNOSIS — N189 Chronic kidney disease, unspecified: Secondary | ICD-10-CM

## 2017-12-29 DIAGNOSIS — N184 Chronic kidney disease, stage 4 (severe): Secondary | ICD-10-CM

## 2018-01-12 ENCOUNTER — Other Ambulatory Visit: Payer: Self-pay | Admitting: Student

## 2018-01-13 ENCOUNTER — Ambulatory Visit (HOSPITAL_COMMUNITY)
Admission: RE | Admit: 2018-01-13 | Discharge: 2018-01-13 | Disposition: A | Discharge status: Home / Self Care | Payer: Medicaid Other | Source: Ambulatory Visit | Attending: Nephrology | Admitting: Nephrology

## 2018-01-13 ENCOUNTER — Encounter (HOSPITAL_COMMUNITY): Payer: Self-pay

## 2018-01-13 ENCOUNTER — Other Ambulatory Visit (HOSPITAL_COMMUNITY): Payer: Self-pay | Admitting: Nephrology

## 2018-01-13 ENCOUNTER — Other Ambulatory Visit: Payer: Self-pay

## 2018-01-13 DIAGNOSIS — IMO0002 Reserved for concepts with insufficient information to code with codable children: Secondary | ICD-10-CM

## 2018-01-13 DIAGNOSIS — N189 Chronic kidney disease, unspecified: Secondary | ICD-10-CM

## 2018-01-13 DIAGNOSIS — N184 Chronic kidney disease, stage 4 (severe): Secondary | ICD-10-CM | POA: Insufficient documentation

## 2018-01-13 DIAGNOSIS — R809 Proteinuria, unspecified: Secondary | ICD-10-CM | POA: Insufficient documentation

## 2018-01-13 DIAGNOSIS — E1129 Type 2 diabetes mellitus with other diabetic kidney complication: Secondary | ICD-10-CM | POA: Insufficient documentation

## 2018-01-13 DIAGNOSIS — D631 Anemia in chronic kidney disease: Secondary | ICD-10-CM | POA: Insufficient documentation

## 2018-01-13 DIAGNOSIS — E1165 Type 2 diabetes mellitus with hyperglycemia: Secondary | ICD-10-CM

## 2018-01-13 DIAGNOSIS — I129 Hypertensive chronic kidney disease with stage 1 through stage 4 chronic kidney disease, or unspecified chronic kidney disease: Secondary | ICD-10-CM | POA: Insufficient documentation

## 2018-01-13 DIAGNOSIS — E1122 Type 2 diabetes mellitus with diabetic chronic kidney disease: Secondary | ICD-10-CM | POA: Insufficient documentation

## 2018-01-13 LAB — PROTIME-INR
INR: 1.11
PROTHROMBIN TIME: 14.2 s (ref 11.4–15.2)

## 2018-01-13 LAB — CBC
HEMATOCRIT: 34.6 % — AB (ref 36.0–46.0)
Hemoglobin: 10.7 g/dL — ABNORMAL LOW (ref 12.0–15.0)
MCH: 27.9 pg (ref 26.0–34.0)
MCHC: 30.9 g/dL (ref 30.0–36.0)
MCV: 90.3 fL (ref 78.0–100.0)
Platelets: 272 10*3/uL (ref 150–400)
RBC: 3.83 MIL/uL — ABNORMAL LOW (ref 3.87–5.11)
RDW: 12.4 % (ref 11.5–15.5)
WBC: 6.6 10*3/uL (ref 4.0–10.5)

## 2018-01-13 LAB — GLUCOSE, CAPILLARY
GLUCOSE-CAPILLARY: 91 mg/dL (ref 70–99)
Glucose-Capillary: 73 mg/dL (ref 70–99)

## 2018-01-13 LAB — APTT: aPTT: 29 seconds (ref 24–36)

## 2018-01-13 MED ORDER — LIDOCAINE HCL (PF) 1 % IJ SOLN
INTRAMUSCULAR | Status: AC
  Filled 2018-01-13: qty 30

## 2018-01-13 MED ORDER — FENTANYL CITRATE (PF) 100 MCG/2ML IJ SOLN
INTRAMUSCULAR | Status: AC | PRN
  Administered 2018-01-13 (×2): 50 ug via INTRAVENOUS

## 2018-01-13 MED ORDER — MIDAZOLAM HCL 2 MG/2ML IJ SOLN
INTRAMUSCULAR | Status: AC
  Filled 2018-01-13: qty 2

## 2018-01-13 MED ORDER — CARVEDILOL 12.5 MG PO TABS
12.5000 mg | ORAL_TABLET | Freq: Once | ORAL | Status: AC
  Administered 2018-01-13: 12.5 mg via ORAL
  Filled 2018-01-13 (×2): qty 1

## 2018-01-13 MED ORDER — CARVEDILOL 12.5 MG PO TABS
ORAL_TABLET | ORAL | Status: AC
  Filled 2018-01-13: qty 1

## 2018-01-13 MED ORDER — MIDAZOLAM HCL 2 MG/2ML IJ SOLN
INTRAMUSCULAR | Status: AC | PRN
  Administered 2018-01-13 (×2): 1 mg via INTRAVENOUS

## 2018-01-13 MED ORDER — SODIUM CHLORIDE 0.9 % IV SOLN
INTRAVENOUS | Status: DC | PRN
  Administered 2018-01-13: 10 mL/h via INTRAVENOUS

## 2018-01-13 MED ORDER — LISINOPRIL 20 MG PO TABS
20.0000 mg | ORAL_TABLET | Freq: Once | ORAL | Status: AC
  Administered 2018-01-13: 20 mg via ORAL
  Filled 2018-01-13 (×3): qty 1

## 2018-01-13 MED ORDER — SODIUM CHLORIDE 0.9 % IV SOLN: INTRAVENOUS | Status: DC

## 2018-01-13 MED ORDER — SODIUM CHLORIDE 0.9 % IV SOLN
INTRAVENOUS | Status: AC | PRN
  Administered 2018-01-13: 10 mL/h via INTRAVENOUS

## 2018-01-13 MED ORDER — FENTANYL CITRATE (PF) 100 MCG/2ML IJ SOLN
INTRAMUSCULAR | Status: AC
  Filled 2018-01-13: qty 2

## 2018-01-13 NOTE — Discharge Instructions (Signed)
**Note Tomorrow Dehaas-identified via Obfuscation** Percutaneous Kidney Biopsy, Care After °This sheet gives you information about how to care for yourself after your procedure. Your health care provider may also give you more specific instructions. If you have problems or questions, contact your health care provider. °What can I expect after the procedure? °After the procedure, it is common to have: °Pain or soreness near the area where the needle went through your skin (biopsy site). °Bright pink or cloudy urine for 24 hours after the procedure. ° °Follow these instructions at home: °Activity °Return to your normal activities as told by your health care provider. Ask your health care provider what activities are safe for you. °Do not drive for 24 hours if you were given a medicine to help you relax (sedative). °Do not lift anything that is heavier than 10 lb (4.5 kg) until your health care provider tells you that it is safe. °Avoid activities that take a lot of effort (are strenuous) until your health care provider approves. Most people will have to wait 2 weeks before returning to activities such as exercise or sexual intercourse. °General instructions °Take over-the-counter and prescription medicines only as told by your health care provider. °You may eat and drink after your procedure. Follow instructions from your health care provider about eating or drinking restrictions. °Check your biopsy site every day for signs of infection. Check for: °More redness, swelling, or pain. °More fluid or blood. °Warmth. °Pus or a bad smell. °Keep all follow-up visits as told by your health care provider. This is important. °Contact a health care provider if: °You have more redness, swelling, or pain around your biopsy site. °You have more fluid or blood coming from your biopsy site. °Your biopsy site feels warm to the touch. °You have pus or a bad smell coming from your biopsy site. °You have blood in your urine more than 24 hours after your procedure. °Get help right away  if: °You have dark red or brown urine. °You have a fever. °You are unable to urinate. °You feel burning when you urinate. °You feel faint. °You have severe pain in your abdomen or side. °This information is not intended to replace advice given to you by your health care provider. Make sure you discuss any questions you have with your health care provider. °Document Released: 02/23/2013 Document Revised: 04/04/2016 Document Reviewed: 04/04/2016 °Elsevier Interactive Patient Education © 2018 Elsevier Inc. ° °

## 2018-01-13 NOTE — Procedures (Signed)
CT guided random renal Bx 16 g times two L lower pole EBL 0 Comp 0

## 2018-01-13 NOTE — H&P (Signed)
Chief Complaint: Patient was seen in consultation today for renal biopsy at the request of [place]  Referring Physician(s): Upton,Elizabeth  Supervising Physician: Marybelle Killings  Patient Status: [facility] - Out-pt  History of Present Illness: Jane Santiago is a 32 y.o. female being worked up for proteinuria. She is referred for image guided random renal biopsy. PMHx, meds, labs, imaging, allergies reviewed. Does have HTN but did not take her normal morning meds today. Feels well, no recent fevers, chills, illness. Has been NPO today as directed. Family at bedside.   Past Medical History:  Diagnosis Date  . Anemia   . Diabetes mellitus without complication ([place])   . Hypertension   . Tachycardia     Past Surgical History:  Procedure Laterality Date  . CESAREAN SECTION     x2    Allergies: Patient has no known allergies.  Medications: Prior to Admission medications   Medication Sig Start Date End Date Taking? Authorizing Provider  carvedilol (COREG) 12.5 MG tablet Take 12.5 mg by mouth 2 (two) times daily with a meal.   Yes [provider]  Cholecalciferol (VITAMIN D3) 2000 units TABS Take 2,000 Units by mouth daily.    Yes [provider]  flintstones complete (FLINTSTONES) 60 MG chewable tablet Chew 2 tablets by mouth daily.   Yes [provider]  furosemide (LASIX) 80 MG tablet Take 80 mg by mouth 2 (two) times daily.   Yes [provider]  Insulin Degludec-Liraglutide (XULTOPHY) 100-3.6 UNIT-MG/ML SOPN Inject 32 Units into the skin at bedtime.   Yes [provider]  lisinopril (PRINIVIL,ZESTRIL) 20 MG tablet TAKE 1 TABLET BY MOUTH DAILY 10/20/16  Yes Hairston, Mandesia R, FNP  Magnesium 250 MG TABS Take 250 mg by mouth daily.   Yes [provider]  omeprazole (PRILOSEC) 40 MG capsule Take 40 mg by mouth daily.    Yes [provider]  glucose blood (ACCU-CHEK AVIVA) test strip Use as  instructed Patient not taking: Reported on 01/04/2018 06/18/16   Alfonse Spruce, FNP  levonorgestrel (MIRENA) 20 MCG/24HR IUD 1 each by Intrauterine route once.    [provider]  metFORMIN (GLUCOPHAGE XR) 500 MG 24 hr tablet Take 2 tablets (1,000 mg total) by mouth 2 (two) times daily with a meal. Patient not taking: Reported on 01/04/2018 06/18/16   Alfonse Spruce, FNP     Family History  Problem Relation Age of Onset  . Thyroid disease Mother   . Cervical cancer Mother   . Diabetes Father   . Hypertension Brother   . Hypertension Sister   . Valvular heart disease Child     Social History   Socioeconomic History  . Marital status: Single    Spouse name: Not on file  . Number of children: Not on file  . Years of education: Not on file  . Highest education level: Not on file  Occupational History  . Not on file  Social Needs  . Financial resource strain: Not on file  . Food insecurity:    Worry: Not on file    Inability: Not on file  . Transportation needs:    Medical: Not on file    Non-medical: Not on file  Tobacco Use  . Smoking status: Never Smoker  . Smokeless tobacco: Never Used  Substance and Sexual Activity  . Alcohol use: No  . Drug use: No  . Sexual activity: Not on file  Lifestyle  . Physical activity:    Days  per week: Not on file    Minutes per session: Not on file  . Stress: Not on file  Relationships  . Social connections:    Talks on phone: Not on file    Gets together: Not on file    Attends religious service: Not on file    Active member of club or organization: Not on file    Attends meetings of clubs or organizations: Not on file    Relationship status: Not on file  Other Topics Concern  . Not on file  Social History Narrative  . Not on file     Review of Systems: A 12 point ROS discussed and pertinent positives are indicated in the HPI above.  All other systems are negative.  Review of Systems  Vital Signs: BP  (!) 149/101   Pulse (!) 105   Temp 98.5 F (36.9 C) (Oral)   Ht 5\' 1"  (1.549 m)   Wt 190 lb (86.2 kg)   SpO2 100%   BMI 35.90 kg/m   Physical Exam  Constitutional: She is oriented to person, place, and time. She appears well-developed. No distress.  HENT:  Head: Normocephalic.  Mouth/Throat: Oropharynx is clear and moist.  Neck: Normal range of motion. No JVD present. No tracheal deviation present.  Cardiovascular: Normal rate, regular rhythm and normal heart sounds.  Pulmonary/Chest: Effort normal and breath sounds normal. No respiratory distress.  Abdominal: Soft. She exhibits no mass. There is no tenderness.  Neurological: She is alert and oriented to person, place, and time.  Skin: Skin is warm and dry.  Psychiatric: She has a normal mood and affect.    Imaging: No results found.  Labs:  CBC: No results for input(s): WBC, HGB, HCT, PLT in the last 8760 hours.  COAGS: No results for input(s): INR, APTT in the last 8760 hours.  BMP: No results for input(s): NA, K, CL, CO2, GLUCOSE, BUN, CALCIUM, CREATININE, GFRNONAA, GFRAA in the last 8760 hours.  Invalid input(s): CMP  LIVER FUNCTION TESTS: No results for input(s): BILITOT, AST, ALT, ALKPHOS, PROT, ALBUMIN in the last 8760 hours.  TUMOR MARKERS: No results for input(s): AFPTM, CEA, CA199, CHROMGRNA in the last 8760 hours.  Assessment and Plan: Proteinuria For US guided random renal biopsy Elevated BP. Will give her normal meds now and monitor. Hopefully BP will come down to a safe/acceptable level for procedure. Labs pending  Pt understands if her BP does not come down, we may have to reschedule. Risks and benefits discussed with the patient including, but not limited to bleeding, hematuria, infection, damage to adjacent structures or low yield requiring additional tests.  All of the patient's questions were answered, patient is agreeable to proceed. Consent signed and in chart.    Thank you for this  interesting consult.  I greatly enjoyed meeting Jane Santiago and look forward to participating in their care.  A copy of this report was sent to the requesting provider on this date.  Electronically Signed: Ascencion Dike, PA-C 01/13/2018, 7:53 AM   I spent a total of 25 minutes in face to face in clinical consultation, greater than 50% of which was counseling/coordinating care for renal biopsy

## 2018-01-13 NOTE — Progress Notes (Signed)
**Note Avy Barlett-Identified via Obfuscation** Patient post renal biopsy. Patient discharged home alert and oriented x4. Patient denies any pain, shortness of breath or dizziness. Dressing clean, dry, and intact. Patient's blood pressure elevated. Dr. Barbie Banner notified of post procedural blood pressures; verbal order given to continue with discharge and instruct patient to take home blood pressure medication as ordered and rest. Patient confirms understanding of discharge instructions and medication teaching.

## 2018-01-13 NOTE — Progress Notes (Signed)
Spoke with Michela Pitcher, PA about need for Preg, test.  States no need for preg test.

## 2018-03-16 DIAGNOSIS — E041 Nontoxic single thyroid nodule: Secondary | ICD-10-CM | POA: Diagnosis not present

## 2018-03-16 DIAGNOSIS — R635 Abnormal weight gain: Secondary | ICD-10-CM

## 2018-03-16 DIAGNOSIS — R300 Dysuria: Secondary | ICD-10-CM

## 2018-04-01 ENCOUNTER — Other Ambulatory Visit: Payer: Self-pay | Admitting: Nurse Practitioner

## 2018-04-01 DIAGNOSIS — R221 Localized swelling, mass and lump, neck: Secondary | ICD-10-CM

## 2018-04-01 DIAGNOSIS — E049 Nontoxic goiter, unspecified: Secondary | ICD-10-CM

## 2018-04-26 ENCOUNTER — Encounter: Payer: Self-pay | Admitting: Nurse Practitioner

## 2018-05-24 ENCOUNTER — Ambulatory Visit: Payer: Medicaid Other | Admitting: Nurse Practitioner

## 2018-05-24 ENCOUNTER — Encounter: Payer: Self-pay | Admitting: Nurse Practitioner

## 2018-05-24 VITALS — BP 118/80 | HR 80 | Temp 98.3°F | Ht 61.0 in | Wt 206.8 lb

## 2018-05-24 DIAGNOSIS — M545 Low back pain, unspecified: Secondary | ICD-10-CM

## 2018-05-24 DIAGNOSIS — R159 Full incontinence of feces: Secondary | ICD-10-CM | POA: Diagnosis not present

## 2018-05-24 NOTE — Progress Notes (Signed)
Subjective:     Patient ID: Jane Santiago , female    DOB: February 04, 1986 , 32 y.o.   MRN: [id number]   Chief Complaint  Patient presents with  . Back Pain  . bowel movements    patient stated she is waking up in the morning and having bowel movements.    HPI  Bowel incontinence - has been occurring for the last 3 months.  Watery stools.  Averaging every few weeks.  She will have a stomach upset as well during that time.  Denies any other family members in home who are sick.  Back Pain  This is a new problem. The current episode started in the past 7 days. The pain is present in the lumbar spine. The quality of the pain is described as shooting and stabbing. The pain radiates to the left thigh. The pain is at a severity of 8/10. The pain is moderate. The pain is worse during the night. The symptoms are aggravated by lying down and sitting. Pertinent negatives include no fever, headaches, leg pain or numbness. Risk factors: Now working a sedentary job. Treatments tried: tylenol. The treatment provided moderate relief.     Past Medical History:  Diagnosis Date  . Anemia   . Diabetes mellitus without complication ([place])   . Hypertension   . Tachycardia      Family History  Problem Relation Age of Onset  . Thyroid disease Mother   . Cervical cancer Mother   . Diabetes Father   . Hypertension Brother   . Hypertension Sister   . Valvular heart disease Child      Current Outpatient Medications:  .  amLODipine (NORVASC) 5 MG tablet, Take 5 mg by mouth daily., Disp: , Rfl:  .  carvedilol (COREG) 12.5 MG tablet, Take 12.5 mg by mouth 2 (two) times daily with a meal., Disp: , Rfl:  .  furosemide (LASIX) 80 MG tablet, Take 80 mg by mouth 2 (two) times daily., Disp: , Rfl:  .  glucose blood (ACCU-CHEK AVIVA) test strip, Use as instructed, Disp: 100 each, Rfl: 12 .  Insulin Degludec-Liraglutide (XULTOPHY) 100-3.6 UNIT-MG/ML SOPN, Inject 32 Units into the skin at bedtime., Disp: , Rfl:  .   levonorgestrel (MIRENA) 20 MCG/24HR IUD, 1 each by Intrauterine route once., Disp: , Rfl:  .  lisinopril (PRINIVIL,ZESTRIL) 20 MG tablet, TAKE 1 TABLET BY MOUTH DAILY, Disp: 30 tablet, Rfl: 0 .  omeprazole (PRILOSEC) 40 MG capsule, Take 40 mg by mouth daily. , Disp: , Rfl:    No Known Allergies   Review of Systems  Constitutional: Negative for fever.  Respiratory: Negative.   Cardiovascular: Negative.   Gastrointestinal: Negative.        Stool incontinence at night time. Denies anal intercourse  Genitourinary: Negative.   Musculoskeletal: Positive for back pain.  Skin: Negative.   Neurological: Negative.  Negative for numbness and headaches.     Today's Vitals   05/24/18 1029  BP: 118/80  Pulse: 80  Temp: 98.3 F (36.8 C)  TempSrc: Oral  Weight: 206 lb 12.8 oz (93.8 kg)  Height: 5\' 1"  (1.549 m)  PainSc: 0-No pain   Body mass index is 39.07 kg/m.   Objective:  Physical Exam  Constitutional: She is oriented to person, place, and time. She appears well-developed and well-nourished.  Cardiovascular: Normal rate, regular rhythm and normal heart sounds.  Pulmonary/Chest: Effort normal and breath sounds normal. She exhibits no tenderness.  Abdominal: Soft. Bowel sounds are normal. She  exhibits no distension. There is no tenderness.  Genitourinary: Rectal exam shows guaiac positive stool (negative). Rectal exam shows no fissure, no mass and no tenderness.  Musculoskeletal: Normal range of motion. She exhibits tenderness (left low back flank area).  Neurological: She is alert and oriented to person, place, and time. No cranial nerve deficit.  Skin: Skin is warm and dry. Capillary refill takes less than 2 seconds.        Assessment And Plan:     1. Acute left-sided low back pain without sciatica  Negative for radiculopathy  Pain to left flank area to touch  Due to the back pain in conjunction with the fecal incontinence this is concerning for caudal equiena (low suspicion).    - MR Lumbar Spine W Wo Contrast; Future  2. Full incontinence of feces  Intermittent loose bowels at night without being aware  Rectal sphincter is normal.  I will also check for her stool culture and ova and parasite.   Negative stool guaic  - MR Lumbar Spine W Wo Contrast; Future - Culture, Stool - Ova and parasite examination      Minette Brine, FNP

## 2018-05-24 NOTE — Patient Instructions (Signed)
Back Pain, Adult Many adults have back pain from time to time. Common causes of back pain include:  A strained muscle or ligament.  Wear and tear (degeneration) of the spinal disks.  Arthritis.  A hit to the back.  Back pain can be short-lived (acute) or last a long time (chronic). A physical exam, lab tests, and imaging studies may be done to find the cause of your pain. Follow these instructions at home: Managing pain and stiffness  Take over-the-counter and prescription medicines only as told by your health care provider.  If directed, apply heat to the affected area as often as told by your health care provider. Use the heat source that your health care provider recommends, such as a moist heat pack or a heating pad. ? Place a towel between your skin and the heat source. ? Leave the heat on for 20-30 minutes. ? Remove the heat if your skin turns bright red. This is especially important if you are unable to feel pain, heat, or cold. You have a greater risk of getting burned.  If directed, apply ice to the injured area: ? Put ice in a plastic bag. ? Place a towel between your skin and the bag. ? Leave the ice on for 20 minutes, 2-3 times a day for the first 2-3 days. Activity  Do not stay in bed. Resting more than 1-2 days can delay your recovery.  Take short walks on even surfaces as soon as you are able. Try to increase the length of time you walk each day.  Do not sit, drive, or stand in one place for more than 30 minutes at a time. Sitting or standing for long periods of time can put stress on your back.  Use proper lifting techniques. When you bend and lift, use positions that put less stress on your back: ? Bend your knees. ? Keep the load close to your body. ? Avoid twisting.  Exercise regularly as told by your health care provider. Exercising will help your back heal faster. This also helps prevent back injuries by keeping muscles strong and flexible.  Your health  care provider may recommend that you see a physical therapist. This person can help you come up with a safe exercise program. Do any exercises as told by your physical therapist. Lifestyle  Maintain a healthy weight. Extra weight puts stress on your back and makes it difficult to have good posture.  Avoid activities or situations that make you feel anxious or stressed. Learn ways to manage anxiety and stress. One way to manage stress is through exercise. Stress and anxiety increase muscle tension and can make back pain worse. General instructions  Sleep on a firm mattress in a comfortable position. Try lying on your side with your knees slightly bent. If you lie on your back, put a pillow under your knees.  Follow your treatment plan as told by your health care provider. This may include: ? Cognitive or behavioral therapy. ? Acupuncture or massage therapy. ? Meditation or yoga. Contact a health care provider if:  You have pain that is not relieved with rest or medicine.  You have increasing pain going down into your legs or buttocks.  Your pain does not improve in 2 weeks.  You have pain at night.  You lose weight.  You have a fever or chills. Get help right away if:  You develop new bowel or bladder control problems.  You have unusual weakness or numbness in your arms   or legs.  You develop nausea or vomiting.  You develop abdominal pain.  You feel faint. Summary  Many adults have back pain from time to time. A physical exam, lab tests, and imaging studies may be done to find the cause of your pain.  Use proper lifting techniques. When you bend and lift, use positions that put less stress on your back.  Take over-the-counter and prescription medicines and apply heat or ice as directed by your health care provider. This information is not intended to replace advice given to you by your health care provider. Make sure you discuss any questions you have with your health care  provider. Document Released: 06/23/2005 Document Revised: 07/28/2016 Document Reviewed: 07/28/2016 Elsevier Interactive Patient Education  2018 [employer]. Fecal Incontinence Fecal incontinence, also called accidental bowel leakage, is not being able to control your bowels. This condition happens because the nerves or muscles around the anus do not work the way they should. This affects their ability to hold stool. What are the causes? This condition may be caused by:  Damage to the muscles at the end of the rectum (sphincter).  Damage to the nerves that control bowel movements.  Diarrhea.  Chronic constipation.  Pelvic floor dysfunction. This means the muscles in the pelvis do not work well.  Loss of bowel storage capacity.  What increases the risk? This condition is more likely to develop in people who:  Are born with bowels or a pelvis that has not formed correctly.  Have had rectal surgery.  Have had radiation treatment for certain cancers.  Have irritable bowel syndrome (IBS).  Have an inflammatory bowel disease (IBD), such as Crohn disease.  Have been pregnant, had a vaginal delivery, or had surgery that damaged the pelvic floor muscles.  Have a complicated childbirth, spinal cord injury, or other trauma that causes nerve damage.  Have a condition that can affect nerve function, such as diabetes, Parkinson disease, or multiple sclerosis.  Have a condition where the rectum drops down into the anus or vagina (prolapse).  Are older.  What are the signs or symptoms? The main symptom of this condition is not being able to control your bowels. You also might not be able get to the bathroom before a bowel movement. How is this diagnosed? This condition is diagnosed with a medical history and physical exam. You may also have tests, including:  Blood tests.  Urine tests.  A rectal exam.  Ultrasound.  MRI.  Colonoscopy. This is an exam that looks at your large  intestine (colon).  Anal manometry. This is a test that measures the strength of the anal sphincter.  Anal electromyogram (EMG). This is a test that uses small electrodes to check for nerve damage.  How is this treated? Treatment varies depending on the cause and severity of your condition. Treatment may also focus on addressing any underlying causes of this condition. Treatment may include:  Medicines. This may include medicines to: ? Prevent diarrhea. ? Help with constipation (laxatives). ? Treat any underlying conditions.  Physical therapy.  Fiber supplements. These can help manage your bowel movements.  Nerve stimulation.  Injectable gel to promote tissue growth and better muscle control.  Surgery. You may need: ? Sphincter repair surgery. ? Diversion surgery. This procedure lets feces pass out of your body through a hole in your abdomen.  Follow these instructions at home: Diet  Follow instructions from your health care provider about any eating or drinking restrictions. Work with a Microbiologist  to come up with a healthy diet and to help you avoid the foods that can make your condition worse. Keep a diet diary to find out which foods or drinks could be making your fecal incontinence worse.  Drink enough fluid to keep your urine clear or pale yellow. Lifestyle  If you smoke, talk to your health care provider about quitting. This may help your condition.  If you are overweight, talk to your health care provider about how to safely lose weight. This may help your condition.  Increase your physical activity as told by your health care provider. This may help your condition. Always talk to your health care provider before starting a new exercise program.  Carry a change of clothes and supplies to clean up quickly if you have an episode of fetal incontinence.  Consider joining a fecal incontinence support group. You can find a support group online or in your local  community. General instructions  Take over-the-counter and prescription medicines only as told by your health care provider. This includes any supplements.  Apply a moisture barrier, such as petroleum jelly, to your rectum. This protects the skin from irritation caused by ongoing leaking or diarrhea.  Tell your health care provider if you are upset or depressed about your condition. Where to find more information: American Academy [employer]icians: www.AromatherapyParty.no International Foundation for Functional Gastrointestinal Disorders: www.iffgd.org Contact a health care provider if:  You have a fever.  You have redness, swelling, or pain around your rectum.  Your pain is getting worse or you lose feeling in your rectal area.  You have blood in your stool.  You feel sad or hopeless.  You avoid social or work situations. Get help right away if:  You stop having bowel movements.  You cannot eat or drink without vomiting.  You have rectal bleeding that does not stop.  You have severe pain that is getting worse.  You have symptoms of dehydration, including: ? Sleepiness or fatigue. ? Producing little or no urine, tears, or sweat. ? Dizziness. ? Dry mouth. ? Unusual irritability. ? Headache. ? Inability to think clearly. This information is not intended to replace advice given to you by your health care provider. Make sure you discuss any questions you have with your health care provider. Document Released: 06/04/2004 Document Revised: 11/29/2015 Document Reviewed: 11/29/2014 Elsevier Interactive Patient Education  [employer].

## 2018-05-29 LAB — OVA AND PARASITE EXAMINATION

## 2018-05-30 LAB — STOOL CULTURE: E coli, Shiga toxin Assay: NEGATIVE

## 2018-06-02 NOTE — Progress Notes (Signed)
Noted, thanks!

## 2018-06-05 ENCOUNTER — Other Ambulatory Visit: Payer: Self-pay | Admitting: Nurse Practitioner

## 2018-06-05 ENCOUNTER — Ambulatory Visit
Admission: RE | Admit: 2018-06-05 | Discharge: 2018-06-05 | Disposition: A | Discharge status: Home / Self Care | Payer: Medicaid Other | Source: Ambulatory Visit | Attending: Nurse Practitioner | Admitting: Nurse Practitioner

## 2018-06-05 DIAGNOSIS — M545 Low back pain, unspecified: Secondary | ICD-10-CM

## 2018-06-05 DIAGNOSIS — R159 Full incontinence of feces: Secondary | ICD-10-CM

## 2018-06-23 ENCOUNTER — Other Ambulatory Visit: Payer: Self-pay | Admitting: Nurse Practitioner

## 2018-06-23 DIAGNOSIS — M545 Low back pain, unspecified: Secondary | ICD-10-CM

## 2018-07-15 ENCOUNTER — Encounter (HOSPITAL_COMMUNITY): Payer: Self-pay | Admitting: Internal Medicine

## 2018-07-15 ENCOUNTER — Inpatient Hospital Stay (HOSPITAL_COMMUNITY)
Admission: EM | Admit: 2018-07-15 | Discharge: 2018-07-17 | DRG: 638 | Disposition: A | Discharge status: Home / Self Care | Payer: Medicaid Other | Attending: Internal Medicine | Admitting: Internal Medicine

## 2018-07-15 ENCOUNTER — Other Ambulatory Visit: Payer: Self-pay

## 2018-07-15 ENCOUNTER — Emergency Department (HOSPITAL_COMMUNITY): Payer: Medicaid Other

## 2018-07-15 DIAGNOSIS — N12 Tubulo-interstitial nephritis, not specified as acute or chronic: Secondary | ICD-10-CM | POA: Diagnosis present

## 2018-07-15 DIAGNOSIS — D631 Anemia in chronic kidney disease: Secondary | ICD-10-CM | POA: Diagnosis present

## 2018-07-15 DIAGNOSIS — N390 Urinary tract infection, site not specified: Secondary | ICD-10-CM | POA: Diagnosis present

## 2018-07-15 DIAGNOSIS — I10 Essential (primary) hypertension: Secondary | ICD-10-CM | POA: Diagnosis present

## 2018-07-15 DIAGNOSIS — N184 Chronic kidney disease, stage 4 (severe): Secondary | ICD-10-CM | POA: Diagnosis present

## 2018-07-15 DIAGNOSIS — N179 Acute kidney failure, unspecified: Secondary | ICD-10-CM | POA: Diagnosis not present

## 2018-07-15 DIAGNOSIS — T383X6A Underdosing of insulin and oral hypoglycemic [antidiabetic] drugs, initial encounter: Secondary | ICD-10-CM | POA: Diagnosis present

## 2018-07-15 DIAGNOSIS — E1122 Type 2 diabetes mellitus with diabetic chronic kidney disease: Secondary | ICD-10-CM | POA: Diagnosis present

## 2018-07-15 DIAGNOSIS — R739 Hyperglycemia, unspecified: Secondary | ICD-10-CM | POA: Diagnosis present

## 2018-07-15 DIAGNOSIS — B9620 Unspecified Escherichia coli [E. coli] as the cause of diseases classified elsewhere: Secondary | ICD-10-CM | POA: Diagnosis present

## 2018-07-15 DIAGNOSIS — I129 Hypertensive chronic kidney disease with stage 1 through stage 4 chronic kidney disease, or unspecified chronic kidney disease: Secondary | ICD-10-CM | POA: Diagnosis present

## 2018-07-15 DIAGNOSIS — Z794 Long term (current) use of insulin: Secondary | ICD-10-CM

## 2018-07-15 DIAGNOSIS — Z599 Problem related to housing and economic circumstances, unspecified: Secondary | ICD-10-CM | POA: Diagnosis not present

## 2018-07-15 DIAGNOSIS — R319 Hematuria, unspecified: Secondary | ICD-10-CM

## 2018-07-15 DIAGNOSIS — N170 Acute kidney failure with tubular necrosis: Secondary | ICD-10-CM

## 2018-07-15 DIAGNOSIS — Z9119 Patient's noncompliance with other medical treatment and regimen: Secondary | ICD-10-CM | POA: Diagnosis not present

## 2018-07-15 DIAGNOSIS — E1165 Type 2 diabetes mellitus with hyperglycemia: Principal | ICD-10-CM | POA: Diagnosis present

## 2018-07-15 DIAGNOSIS — Z79899 Other long term (current) drug therapy: Secondary | ICD-10-CM

## 2018-07-15 LAB — CBG MONITORING, ED
Glucose-Capillary: 571 mg/dL (ref 70–99)
Glucose-Capillary: 395 mg/dL — ABNORMAL HIGH (ref 70–99)
Glucose-Capillary: 336 mg/dL — ABNORMAL HIGH (ref 70–99)

## 2018-07-15 LAB — URINALYSIS, ROUTINE W REFLEX MICROSCOPIC
Bilirubin Urine: NEGATIVE
Glucose, UA: >=500 mg/dL — AB
Ketones, ur: NEGATIVE mg/dL
Nitrite: NEGATIVE
PH: 6 (ref 5.0–8.0)
Protein, ur: 100 mg/dL — AB
Specific Gravity, Urine: 1.015 (ref 1.005–1.030)
WBC, UA: >50 WBC/hpf — ABNORMAL HIGH (ref 0–5)

## 2018-07-15 LAB — COMPREHENSIVE METABOLIC PANEL
ALT: 13 U/L (ref 0–44)
AST: 11 U/L — ABNORMAL LOW (ref 15–41)
Albumin: 2.7 g/dL — ABNORMAL LOW (ref 3.5–5.0)
Alkaline Phosphatase: 100 U/L (ref 38–126)
Anion gap: 6 (ref 5–15)
BUN: 36 mg/dL — ABNORMAL HIGH (ref 6–20)
CHLORIDE: 103 mmol/L (ref 98–111)
CO2: 20 mmol/L — ABNORMAL LOW (ref 22–32)
Calcium: 8.4 mg/dL — ABNORMAL LOW (ref 8.9–10.3)
Creatinine, Ser: 3.73 mg/dL — ABNORMAL HIGH (ref 0.44–1.00)
GFR calc Af Amer: 18 mL/min — ABNORMAL LOW (ref >60)
GFR calc non Af Amer: 15 mL/min — ABNORMAL LOW (ref >60)
Glucose, Bld: 607 mg/dL (ref 70–99)
POTASSIUM: 5 mmol/L (ref 3.5–5.1)
Sodium: 129 mmol/L — ABNORMAL LOW (ref 135–145)
Total Bilirubin: 0.5 mg/dL (ref 0.3–1.2)
Total Protein: 6.8 g/dL (ref 6.5–8.1)

## 2018-07-15 LAB — CBC WITH DIFFERENTIAL/PLATELET
Abs Immature Granulocytes: 0.02 10*3/uL (ref 0.00–0.07)
Basophils Absolute: 0 10*3/uL (ref 0.0–0.1)
Basophils Relative: 0 %
Eosinophils Absolute: 0.1 10*3/uL (ref 0.0–0.5)
Eosinophils Relative: 1 %
HCT: 31.1 % — ABNORMAL LOW (ref 36.0–46.0)
Hemoglobin: 9.9 g/dL — ABNORMAL LOW (ref 12.0–15.0)
Immature Granulocytes: 0 %
LYMPHS PCT: 28 %
Lymphs Abs: 1.7 10*3/uL (ref 0.7–4.0)
MCH: 27.4 pg (ref 26.0–34.0)
MCHC: 31.8 g/dL (ref 30.0–36.0)
MCV: 86.1 fL (ref 80.0–100.0)
Monocytes Absolute: 0.5 10*3/uL (ref 0.1–1.0)
Monocytes Relative: 8 %
Neutro Abs: 4 10*3/uL (ref 1.7–7.7)
Neutrophils Relative %: 63 %
Platelets: 225 10*3/uL (ref 150–400)
RBC: 3.61 MIL/uL — AB (ref 3.87–5.11)
RDW: 11.9 % (ref 11.5–15.5)
WBC: 6.3 10*3/uL (ref 4.0–10.5)
nRBC: 0 % (ref 0.0–0.2)

## 2018-07-15 LAB — I-STAT BETA HCG BLOOD, ED (MC, WL, AP ONLY): I-stat hCG, quantitative: <5 m[IU]/mL (ref <5)

## 2018-07-15 LAB — HEMOGLOBIN A1C
HEMOGLOBIN A1C: 11.3 % — AB (ref 4.8–5.6)
MEAN PLASMA GLUCOSE: 277.61 mg/dL

## 2018-07-15 LAB — GLUCOSE, CAPILLARY
Glucose-Capillary: 324 mg/dL — ABNORMAL HIGH (ref 70–99)
Glucose-Capillary: 149 mg/dL — ABNORMAL HIGH (ref 70–99)

## 2018-07-15 MED ORDER — SODIUM CHLORIDE 0.9 % IV SOLN
INTRAVENOUS | Status: DC
  Administered 2018-07-15 – 2018-07-16 (×3): via INTRAVENOUS

## 2018-07-15 MED ORDER — HEPARIN SODIUM (PORCINE) 5000 UNIT/ML IJ SOLN
5000.0000 [IU] | Freq: Three times a day (TID) | INTRAMUSCULAR | Status: DC
  Administered 2018-07-15 – 2018-07-17 (×6): 5000 [IU] via SUBCUTANEOUS
  Filled 2018-07-15 (×6): qty 1

## 2018-07-15 MED ORDER — ACETAMINOPHEN 650 MG RE SUPP: 650.0000 mg | Freq: Four times a day (QID) | RECTAL | Status: DC | PRN

## 2018-07-15 MED ORDER — AMLODIPINE BESYLATE 5 MG PO TABS
5.0000 mg | ORAL_TABLET | Freq: Every day | ORAL | Status: DC
  Administered 2018-07-15 – 2018-07-17 (×3): 5 mg via ORAL
  Filled 2018-07-15 (×3): qty 1

## 2018-07-15 MED ORDER — PROMETHAZINE HCL 25 MG PO TABS: 12.5000 mg | ORAL_TABLET | Freq: Four times a day (QID) | ORAL | Status: DC | PRN

## 2018-07-15 MED ORDER — ACETAMINOPHEN 325 MG PO TABS
650.0000 mg | ORAL_TABLET | Freq: Four times a day (QID) | ORAL | Status: DC | PRN
  Administered 2018-07-16 – 2018-07-17 (×2): 650 mg via ORAL
  Filled 2018-07-15 (×2): qty 2

## 2018-07-15 MED ORDER — INSULIN DEGLUDEC-LIRAGLUTIDE 100-3.6 UNIT-MG/ML ~~LOC~~ SOPN
32.0000 [IU] | PEN_INJECTOR | Freq: Every day | SUBCUTANEOUS | Status: DC
  Administered 2018-07-15 – 2018-07-16 (×2): 32 [IU] via SUBCUTANEOUS
  Filled 2018-07-15: qty 1

## 2018-07-15 MED ORDER — SODIUM CHLORIDE 0.9 % IV SOLN
1.0000 g | INTRAVENOUS | Status: DC
  Administered 2018-07-15 – 2018-07-16 (×2): 1 g via INTRAVENOUS
  Filled 2018-07-15 (×2): qty 10

## 2018-07-15 MED ORDER — INSULIN ASPART 100 UNIT/ML ~~LOC~~ SOLN
0.0000 [IU] | Freq: Three times a day (TID) | SUBCUTANEOUS | Status: DC
  Administered 2018-07-15: 11 [IU] via SUBCUTANEOUS
  Administered 2018-07-16: 2 [IU] via SUBCUTANEOUS
  Administered 2018-07-16: 3 [IU] via SUBCUTANEOUS
  Administered 2018-07-16: 2 [IU] via SUBCUTANEOUS
  Administered 2018-07-17: 5 [IU] via SUBCUTANEOUS

## 2018-07-15 MED ORDER — INSULIN ASPART 100 UNIT/ML ~~LOC~~ SOLN
8.0000 [IU] | Freq: Once | SUBCUTANEOUS | Status: AC
  Administered 2018-07-15: 8 [IU] via SUBCUTANEOUS

## 2018-07-15 MED ORDER — SODIUM CHLORIDE 0.9 % IV SOLN
1.0000 g | Freq: Once | INTRAVENOUS | Status: AC
  Administered 2018-07-15: 1 g via INTRAVENOUS
  Filled 2018-07-15: qty 10

## 2018-07-15 MED ORDER — SODIUM CHLORIDE 0.9 % IV BOLUS
1000.0000 mL | Freq: Once | INTRAVENOUS | Status: AC
  Administered 2018-07-15: 1000 mL via INTRAVENOUS

## 2018-07-15 MED ORDER — PANTOPRAZOLE SODIUM 40 MG PO TBEC
40.0000 mg | DELAYED_RELEASE_TABLET | Freq: Every day | ORAL | Status: DC
  Administered 2018-07-15 – 2018-07-17 (×3): 40 mg via ORAL
  Filled 2018-07-15 (×3): qty 1

## 2018-07-15 MED ORDER — CARVEDILOL 12.5 MG PO TABS
12.5000 mg | ORAL_TABLET | Freq: Two times a day (BID) | ORAL | Status: DC
  Administered 2018-07-15 – 2018-07-17 (×4): 12.5 mg via ORAL
  Filled 2018-07-15 (×4): qty 1

## 2018-07-15 NOTE — ED Provider Notes (Signed)
[facility] EMERGENCY DEPARTMENT Provider Note   CSN: [id number] Arrival date & time: 07/15/18  0803     History   Chief Complaint No chief complaint on file.   HPI Jane Santiago is a 33 y.o. female.  HPI   Pt is a 33 y/o female with a h/o anemia, DM, HTN, who presents to the ED today for evaluation of hyperglycemia and foot cramping.  Patient states that she ran out of her insulin 1 week ago and since then has had a dry mouth, increased thirst and urinary frequency.  Denies any dysuria fevers or chills.  She is also been experiencing muscle cramping to her bilateral feet.  No open wounds to the feet.  Has had bilateral lower extremity edema but has not noticed any worsening recently.  No chest pain or shortness of breath.  She has had a recent URI including rhinorrhea, nasal congestion, sore throat and cough however symptoms have been somewhat improving since onset.  No abdominal pain, nausea, vomiting or diarrhea.  States she has been compliant with her blood pressure medication and took her Lasix this morning.   Patient states she has missed 1 week of her insulin due to financial and insurance reasons  Last echo reviewed from 2018, pt with preserved EF.  Past Medical History:  Diagnosis Date  . Anemia   . Diabetes mellitus without complication ([place])   . Hypertension   . Tachycardia     Patient Active Problem List   Diagnosis Date Noted  . Multinodular goiter 06/08/2015  . Essential hypertension 05/19/2014  . Leukocytosis 05/14/2014  . Right flank pain 05/14/2014  . Anemia 05/14/2014  . Diabetes ([place]) 05/14/2014  . Diabetes in pregnancy 04/08/2011    Past Surgical History:  Procedure Laterality Date  . CESAREAN SECTION     x2     OB History    Gravida  5   Para  1   Term  1   Preterm  0   AB  3   Living  1     SAB  3   TAB  0   Ectopic  0   Multiple  0   Live Births               Home Medications    Prior to Admission  medications   Medication Sig Start Date End Date Taking? Authorizing Provider  furosemide (LASIX) 80 MG tablet Take 80 mg by mouth 2 (two) times daily.   Yes [provider]  amLODipine (NORVASC) 5 MG tablet Take 5 mg by mouth daily.    [provider]  carvedilol (COREG) 12.5 MG tablet Take 12.5 mg by mouth 2 (two) times daily with a meal.    [provider]  glucose blood (ACCU-CHEK AVIVA) test strip Use as instructed 06/18/16   Fredia Beets R, FNP  Insulin Degludec-Liraglutide (XULTOPHY) 100-3.6 UNIT-MG/ML SOPN Inject 32 Units into the skin at bedtime.    [provider]  levonorgestrel (MIRENA) 20 MCG/24HR IUD 1 each by Intrauterine route once.    [provider]  lisinopril (PRINIVIL,ZESTRIL) 20 MG tablet TAKE 1 TABLET BY MOUTH DAILY 10/20/16   Alfonse Spruce, FNP  omeprazole (PRILOSEC) 40 MG capsule Take 40 mg by mouth daily.     [provider]    Family History Family History  Problem Relation Age of Onset  . Thyroid disease Mother   . Cervical cancer Mother   . Diabetes Father   .  Hypertension Brother   . Hypertension Sister   . Valvular heart disease Child     Social History Social History   Tobacco Use  . Smoking status: Never Smoker  . Smokeless tobacco: Never Used  Substance Use Topics  . Alcohol use: No  . Drug use: No     Allergies   Patient has no known allergies.   Review of Systems Review of Systems  Constitutional: Negative for fever.  HENT: Positive for congestion, rhinorrhea, sneezing and sore throat. Negative for ear pain.   Eyes: Negative for visual disturbance.  Respiratory: Positive for cough. Negative for shortness of breath.   Cardiovascular: Positive for leg swelling. Negative for chest pain.  Gastrointestinal: Negative for abdominal pain, constipation, diarrhea, nausea and vomiting.  Endocrine: Positive for polydipsia and polyuria.  Genitourinary: Positive for frequency.  Negative for dysuria, flank pain, hematuria and urgency.  Musculoskeletal: Negative for back pain.  Skin: Negative for rash.  Neurological: Positive for headaches. Negative for dizziness, speech difficulty, weakness, light-headedness and numbness.  All other systems reviewed and are negative.    Physical Exam Updated Vital Signs BP (!) 154/99   Pulse 87   Temp 98.8 F (37.1 C) (Oral)   Resp 17   SpO2 100%   Physical Exam Vitals signs and nursing note reviewed.  Constitutional:      General: She is not in acute distress.    Appearance: She is well-developed. She is not ill-appearing.  HENT:     Head: Normocephalic and atraumatic.     Comments: No frontal or maxillary sinus ttp    Right Ear: Tympanic membrane normal.     Left Ear: There is impacted cerumen.     Nose:     Comments: bilat nasal turbinates swollen    Mouth/Throat:     Pharynx: No oropharyngeal exudate or posterior oropharyngeal erythema.  Eyes:     Conjunctiva/sclera: Conjunctivae normal.  Neck:     Musculoskeletal: Neck supple.  Cardiovascular:     Rate and Rhythm: Normal rate and regular rhythm.     Heart sounds: Normal heart sounds. No murmur.  Pulmonary:     Effort: Pulmonary effort is normal. No respiratory distress.     Breath sounds: Normal breath sounds. No stridor. No wheezing or rhonchi.  Abdominal:     General: Bowel sounds are normal.     Palpations: Abdomen is soft.     Tenderness: There is no abdominal tenderness. There is no right CVA tenderness, left CVA tenderness, guarding or rebound.  Musculoskeletal:     Right lower leg: Edema (3+) present.     Left lower leg: Edema ( 3+) present.  Skin:    General: Skin is warm and dry.  Neurological:     Mental Status: She is alert.  Psychiatric:        Mood and Affect: Mood normal.      ED Treatments / Results  Labs (all labs ordered are listed, but only abnormal results are displayed) Labs Reviewed  COMPREHENSIVE METABOLIC PANEL -  Abnormal; Notable for the following components:      Result Value   Sodium 129 (*)    CO2 20 (*)    Glucose, Bld 607 (*)    BUN 36 (*)    Creatinine, Ser 3.73 (*)    Calcium 8.4 (*)    Albumin 2.7 (*)    AST 11 (*)    GFR calc non Af Amer 15 (*)    GFR calc Af Wyvonnia Lora  18 (*)    All other components within normal limits  CBC WITH DIFFERENTIAL/PLATELET - Abnormal; Notable for the following components:   RBC 3.61 (*)    Hemoglobin 9.9 (*)    HCT 31.1 (*)    All other components within normal limits  URINALYSIS, ROUTINE W REFLEX MICROSCOPIC - Abnormal; Notable for the following components:   APPearance HAZY (*)    Glucose, UA >=500 (*)    Hgb urine dipstick SMALL (*)    Protein, ur 100 (*)    Leukocytes, UA MODERATE (*)    WBC, UA >50 (*)    Bacteria, UA MANY (*)    All other components within normal limits  CBG MONITORING, ED - Abnormal; Notable for the following components:   Glucose-Capillary 571 (*)    All other components within normal limits  URINE CULTURE  I-STAT BETA HCG BLOOD, ED ([facility], [facility], [facility] ONLY)    EKG None  Radiology Dg Chest 2 View  Result Date: 07/15/2018 CLINICAL DATA:  Cough.  Hyperglycemia. EXAM: CHEST - 2 VIEW COMPARISON:  Chest radiograph January 04, 2014 and chest CT May 15, 2014 FINDINGS: Lungs are clear. Heart size and pulmonary vascularity are normal. No adenopathy. No bone lesions. IMPRESSION: No edema or consolidation. Electronically Signed   By: Lowella Grip III M.D.   On: 07/15/2018 11:03    Procedures Procedures (including critical care time)  Medications Ordered in ED Medications  sodium chloride 0.9 % bolus 1,000 mL (1,000 mLs Intravenous New Bag/Given 07/15/18 0959)  insulin aspart (novoLOG) injection 8 Units (8 Units Subcutaneous Given 07/15/18 1001)  cefTRIAXone (ROCEPHIN) 1 g in sodium chloride 0.9 % 100 mL IVPB (1 g Intravenous New Bag/Given 07/15/18 1000)     Initial Impression / Assessment and Plan / ED Course  I have reviewed the  triage vital signs and the nursing notes.  Pertinent labs & imaging results that were available during my care of the patient were reviewed by me and considered in my medical decision making (see chart for details).     Final Clinical Impressions(s) / ED Diagnoses   Final diagnoses:  Acute renal failure, unspecified acute renal failure type ([place])  Hyperglycemia  Urinary tract infection with hematuria, site unspecified   Patient presenting to the ED today complaining of hyperglycemia and bilateral foot cramping.  Recently has had urinary frequency polyphagia and polydipsia.  She has missed 1 week of her insulin.  She is found to be afebrile, slightly hypertensive but otherwise normal vitals.  Peripheral edema noted however lung sounds clear bilaterally.  No abdominal tenderness.  Initial CBG elevated at 571. CBC without leukocytosis.  She is anemic at 9.9, this is slightly lower than 6 months ago.  She reportedly has stopped taking her iron supplementation. CMP shows hyponatremia with sodium of 129, low bicarb at 20, elevated blood glucose at 607, elevated BUN and creatinine at 36 and 3.73.  Creatinine worse from prior of 1.09.  She has no elevated anion gap. 8 hCG negative. UA with glucosuria, hematuria and proteinuria.  Also with leukocytes.  No nitrites present and no ketones present.  There is greater than 50 white blood cells and many bacteria noted suggestive of urinary tract infection.  CXR without pneumonia or other abnormality.   Patient with hyperglycemia without lab evidence of DKA.  She is recently missed 1 week of insulin and also has a urinary tract infection which is likely contributing to her hyperglycemia.  IV fluids, insulin Rocephin given in the  ED.  Patient will require admission for further treatment of her hyperglycemia and acute renal failure.  11:23 AM CONSULT With Dr. Sloan Leiter with hospitalist service who accepts pt for admission.  ED Discharge Orders    None         Bishop Dublin 07/15/18 1124    Julianne Rice, MD 07/15/18 1133

## 2018-07-15 NOTE — ED Notes (Signed)
ED Provider at bedside. 

## 2018-07-15 NOTE — ED Notes (Signed)
Carb modified meal tray ordered for patient at this time

## 2018-07-15 NOTE — ED Triage Notes (Signed)
Pt endorses blood sugar 594 and states "I am having muscle spasms in my right foot" since yesterday. Denies n/v or abd pain but endorses back pain. Has been urinating and drinking a lot. VSS.

## 2018-07-15 NOTE — H&P (Signed)
History and Physical    QUILLA FREEZE GNF:[id number] DOB: Apr 07, 1986 DOA: 07/15/2018  PCP: Minette Brine, FNP  Patient coming from: Home.  I have personally briefly reviewed patient's old medical records in Epic and Chart everywhere.   Chief Complaint: High blood sugars, left flank pain, ran out of the insulin.  HPI: Jane Santiago is a 33 y.o. 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  According to the patient, she had been doing well on her insulin doses.  However, for some coverage reason she could not get her insulin since last 1 week.  She does have a prescription in the pharmacy today.  Patient since 1 week started having dry mouth, increased thirst and urinary frequency.  She does not have any dysuria fever or chills, however she has some left flank pain, dull ache, no radiation, about 5 out of 10 with no exacerbating or relieving factors.  Patient is also having leg spasms and cramping mostly on the feet.  Patient has chronic bilateral leg edema.  Her blood sugar was 595 at home so came to the ER. Patient denies any nausea vomiting.  She does have reflux disease, however no active symptoms.  Denies any chest pain or shortness of breath.  Denies any wheezing.  Denies any headache, blurry vision, dizziness or syncopal episode.  Denies any abdominal pain other than flank pain above.  Leg cramping, however no focal weakness.  ED Course: In the emergency room, hemodynamically stable with borderline elevated blood pressures.  Blood sugar 607, given 8 units of subcu insulin and 1 L of normal saline, repeat blood sugar is 395.  She does not have any anion gap.  Urinalysis shows possible infection, however there is no ketones.  Patient is able to eat without nausea vomiting and there is no  anion gap.  Her creatinine was found to be significantly elevated to 3.73.  Patient does endorse history of stage IV disease and proteinuria, records available with US shows creatinine of 1.09, however that was almost 2 years ago.  She does follow-up with [facility], does not remember baseline creatinine.  Review of Systems: As per HPI otherwise 10 point review of systems negative.    Past Medical History:  Diagnosis Date  . Anemia   . Diabetes mellitus without complication ([place])   . Hypertension   . Tachycardia     Past Surgical History:  Procedure Laterality Date  . CESAREAN SECTION     x2     reports that she has never smoked. She has never used smokeless tobacco. She reports that she does not drink alcohol or use drugs.  No Known Allergies  Family History  Problem Relation Age of Onset  . Thyroid disease Mother   . Cervical cancer Mother   . Diabetes Father   . Hypertension Brother   . Hypertension Sister   . Valvular heart disease Child      Prior to Admission medications   Medication Sig Start Date End Date Taking? Authorizing Provider  furosemide (LASIX) 80 MG tablet Take 80 mg by mouth 2 (two) times daily.   Yes [provider]  amLODipine (NORVASC) 5 MG tablet Take 5 mg by mouth daily.    [provider]  carvedilol (COREG) 12.5  MG tablet Take 12.5 mg by mouth 2 (two) times daily with a meal.    [provider]  glucose blood (ACCU-CHEK AVIVA) test strip Use as instructed 06/18/16   Fredia Beets R, FNP  Insulin Degludec-Liraglutide (XULTOPHY) 100-3.6 UNIT-MG/ML SOPN Inject 32 Units into the skin at bedtime.    [provider]  levonorgestrel (MIRENA) 20 MCG/24HR IUD 1 each by Intrauterine route once.    [provider]  lisinopril (PRINIVIL,ZESTRIL) 20 MG tablet TAKE 1 TABLET BY MOUTH DAILY 10/20/16   Alfonse Spruce, FNP  omeprazole (PRILOSEC) 40 MG capsule Take 40 mg by mouth daily.     [provider]    Physical Exam: Vitals:   07/15/18 0945 07/15/18 1014 07/15/18 1030 07/15/18 1130  BP: (!) 154/99  (!) 174/100   Pulse: 88 87 88 88  Resp: 19 17 20 16   Temp:      TempSrc:      SpO2: 100% 100% 100% 100%    Constitutional: NAD, calm, comfortable Vitals:   07/15/18 0945 07/15/18 1014 07/15/18 1030 07/15/18 1130  BP: (!) 154/99  (!) 174/100   Pulse: 88 87 88 88  Resp: 19 17 20 16   Temp:      TempSrc:      SpO2: 100% 100% 100% 100%   Eyes: PERRL, lids and conjunctivae normal. ENMT: Mucous membranes are moist. Posterior pharynx clear of any exudate or lesions.Normal dentition.  Neck: normal, supple, no masses, no thyromegaly Respiratory: clear to auscultation bilaterally, no wheezing, no crackles. Normal respiratory effort. No accessory muscle use.  Cardiovascular: Regular rate and rhythm, no murmurs / rubs / gallops.  2+ bilateral pedal edema.  2+ pedal pulses. No carotid bruits.  Abdomen: no tenderness, no masses palpated. No hepatosplenomegaly. Bowel sounds positive.  Obese. Musculoskeletal: no clubbing / cyanosis. No joint deformity upper and lower extremities. Good ROM, no contractures. Normal muscle tone.  Skin: no rashes, lesions, ulcers. No induration Neurologic: CN 2-12 grossly intact. Sensation intact, DTR normal. Strength 5/5 in all 4.  Psychiatric: Normal judgment and insight. Alert and oriented x 3. Normal mood.     Labs on Admission: I have personally reviewed following labs and imaging studies  CBC: Recent Labs  Lab 07/15/18 0821  WBC 6.3  NEUTROABS 4.0  HGB 9.9*  HCT 31.1*  MCV 86.1  PLT 341   Basic Metabolic Panel: Recent Labs  Lab 07/15/18 0821  NA 129*  K 5.0  CL 103  CO2 20*  GLUCOSE 607*  BUN 36*  CREATININE 3.73*  CALCIUM 8.4*   GFR: CrCl cannot be calculated (Unknown ideal weight.). Liver Function Tests: Recent Labs  Lab 07/15/18 0821  AST 11*  ALT 13  ALKPHOS 100  BILITOT 0.5  PROT 6.8  ALBUMIN 2.7*   No  results for input(s): LIPASE, AMYLASE in the last 168 hours. No results for input(s): AMMONIA in the last 168 hours. Coagulation Profile: No results for input(s): INR, PROTIME in the last 168 hours. Cardiac Enzymes: No results for input(s): CKTOTAL, CKMB, CKMBINDEX, TROPONINI in the last 168 hours. BNP (last 3 results) No results for input(s): PROBNP in the last 8760 hours. HbA1C: No results for input(s): HGBA1C in the last 72 hours. CBG: Recent Labs  Lab 07/15/18 0810 07/15/18 1128  GLUCAP 571* 395*   Lipid Profile: No results for input(s): CHOL, HDL, LDLCALC, TRIG, CHOLHDL, LDLDIRECT in the last 72 hours. Thyroid Function Tests: No results for input(s): TSH, T4TOTAL, FREET4, T3FREE, THYROIDAB in the  last 72 hours. Anemia Panel: No results for input(s): VITAMINB12, FOLATE, FERRITIN, TIBC, IRON, RETICCTPCT in the last 72 hours. Urine analysis:    Component Value Date/Time   COLORURINE YELLOW 07/15/2018 0807   APPEARANCEUR HAZY (A) 07/15/2018 0807   LABSPEC 1.015 07/15/2018 0807   PHURINE 6.0 07/15/2018 0807   GLUCOSEU >=500 (A) 07/15/2018 0807   HGBUR SMALL (A) 07/15/2018 0807   BILIRUBINUR NEGATIVE 07/15/2018 0807   BILIRUBINUR neg 06/18/2016 1136   KETONESUR NEGATIVE 07/15/2018 0807   PROTEINUR 100 (A) 07/15/2018 0807   UROBILINOGEN 0.2 06/18/2016 1136   UROBILINOGEN 1.0 05/14/2014 1331   NITRITE NEGATIVE 07/15/2018 0807   LEUKOCYTESUR MODERATE (A) 07/15/2018 0807    Radiological Exams on Admission: Dg Chest 2 View  Result Date: 07/15/2018 CLINICAL DATA:  Cough.  Hyperglycemia. EXAM: CHEST - 2 VIEW COMPARISON:  Chest radiograph January 04, 2014 and chest CT May 15, 2014 FINDINGS: Lungs are clear. Heart size and pulmonary vascularity are normal. No adenopathy. No bone lesions. IMPRESSION: No edema or consolidation. Electronically Signed   By: Lowella Grip III M.D.   On: 07/15/2018 11:03    EKG: Not indicated today.  Assessment/Plan Principal Problem:    Diabetes mellitus with hyperglycemia (H[facility]) Active Problems:   Essential hypertension   Acute lower UTI   Acute renal failure ([facility])   Hyperglycemia   Type 2 diabetes with uncontrolled blood sugars, normal anion gap and no evidence of complications: Due to missed insulin doses. Agree with admission to [facility] bed. Accu-Cheks every 4 hours and cover with sliding scale insulin. Resume her home doses of Xultophy 100/3.6, 32 units at night.  Check A1c. Low carbohydrate diet. Counseled not to miss further insulin doses at home.  Acute UTI present on admission: Suspected.  Culture sent.  Continue Rocephin until then.  History of pansensitive E. coli UTI.  Acute kidney injury on chronic diabetic kidney disease stage IV: Probably prerenal renal failure.  Suspect due to uncontrolled blood sugars.  Received 1 L of fluid bolus in the ER.  Will continue on maintenance IV fluids today even though she has some leg edema.  Intake and output monitoring.  Recheck levels in the morning.  If no improvement, will discuss with her nephrology team.  Holding lisinopril and diuretics.  Hypertension: Missed medications.  Fairly controlled.  Resume amlodipine and beta-blockers.  Hold lisinopril and diuretics.   DVT prophylaxis: Heparin subcu. Code Status: Full code. Family Communication: Patient able to make medical decisions.  Mother at the bedside. Disposition Plan: Home in 24 to 48 hours. Consults called: None. Admission status: Inpatient.   Barb Merino MD [facility] Pager [phone]  If 7PM-7AM, please contact night-coverage [email or web address] Password [email or web address]  07/15/2018, 11:52 AM

## 2018-07-15 NOTE — ED Notes (Signed)
Patient ambulatory to bathroom with steady gait at this time 

## 2018-07-15 NOTE — Progress Notes (Signed)
Jane Santiago [id number]  Code Status: FULL  Admission Data: 07/15/2018 7:55 PM  Attending Provider: Raelyn Mora  VWP:[id number], Doreene Burke, FNP  Consults/ Treatment Team:   [name] is a 33 y.o. female patient admitted from ED awake, alert - oriented X 4 - no acute distress noted. VSS - Blood pressure (!) 168/98, pulse 94, temperature 98.9 F (37.2 C), temperature source Oral, resp. rate 14, height 5\' 1"  (1.549 m), SpO2 99 %, unknown if currently breastfeeding. no c/o shortness of breath, no c/o chest pain. Orientation to room, and floor completed with information packet given to patient/family. Admission INP armband ID verified with patient/family, and in place. Call light within reach, patient able to voice, and demonstrate understanding. Skin, clean-dry- intact without evidence of bruising, or skin tears.  No evidence of skin break down noted on exam.  ?  Will cont to eval and treat per MD orders.  Jane Florida, RN

## 2018-07-16 DIAGNOSIS — R319 Hematuria, unspecified: Secondary | ICD-10-CM

## 2018-07-16 DIAGNOSIS — E1165 Type 2 diabetes mellitus with hyperglycemia: Principal | ICD-10-CM

## 2018-07-16 DIAGNOSIS — Z794 Long term (current) use of insulin: Secondary | ICD-10-CM

## 2018-07-16 DIAGNOSIS — N179 Acute kidney failure, unspecified: Secondary | ICD-10-CM

## 2018-07-16 DIAGNOSIS — N390 Urinary tract infection, site not specified: Secondary | ICD-10-CM

## 2018-07-16 DIAGNOSIS — I10 Essential (primary) hypertension: Secondary | ICD-10-CM

## 2018-07-16 LAB — CBC
HCT: 29.5 % — ABNORMAL LOW (ref 36.0–46.0)
Hemoglobin: 9.5 g/dL — ABNORMAL LOW (ref 12.0–15.0)
MCH: 27.2 pg (ref 26.0–34.0)
MCHC: 32.2 g/dL (ref 30.0–36.0)
MCV: 84.5 fL (ref 80.0–100.0)
Platelets: 218 10*3/uL (ref 150–400)
RBC: 3.49 MIL/uL — ABNORMAL LOW (ref 3.87–5.11)
RDW: 12 % (ref 11.5–15.5)
WBC: 6.1 10*3/uL (ref 4.0–10.5)
nRBC: 0 % (ref 0.0–0.2)

## 2018-07-16 LAB — BASIC METABOLIC PANEL
Anion gap: 7 (ref 5–15)
BUN: 31 mg/dL — ABNORMAL HIGH (ref 6–20)
CHLORIDE: 110 mmol/L (ref 98–111)
CO2: 19 mmol/L — AB (ref 22–32)
Calcium: 8.2 mg/dL — ABNORMAL LOW (ref 8.9–10.3)
Creatinine, Ser: 3.45 mg/dL — ABNORMAL HIGH (ref 0.44–1.00)
GFR calc Af Amer: 19 mL/min — ABNORMAL LOW (ref >60)
GFR calc non Af Amer: 17 mL/min — ABNORMAL LOW (ref >60)
Glucose, Bld: 167 mg/dL — ABNORMAL HIGH (ref 70–99)
Potassium: 4 mmol/L (ref 3.5–5.1)
Sodium: 136 mmol/L (ref 135–145)

## 2018-07-16 LAB — GLUCOSE, CAPILLARY
GLUCOSE-CAPILLARY: 174 mg/dL — AB (ref 70–99)
Glucose-Capillary: 123 mg/dL — ABNORMAL HIGH (ref 70–99)
Glucose-Capillary: 140 mg/dL — ABNORMAL HIGH (ref 70–99)
Glucose-Capillary: 150 mg/dL — ABNORMAL HIGH (ref 70–99)
Glucose-Capillary: 153 mg/dL — ABNORMAL HIGH (ref 70–99)
Glucose-Capillary: 162 mg/dL — ABNORMAL HIGH (ref 70–99)
Glucose-Capillary: 172 mg/dL — ABNORMAL HIGH (ref 70–99)

## 2018-07-16 MED ORDER — SODIUM CHLORIDE 0.9 % IV SOLN
INTRAVENOUS | Status: DC
  Administered 2018-07-16: 17:00:00 via INTRAVENOUS
  Administered 2018-07-17: 1000 mL via INTRAVENOUS

## 2018-07-16 NOTE — Progress Notes (Signed)
Inpatient Diabetes Program Recommendations  AACE/ADA: New Consensus Statement on Inpatient Glycemic Control (2015)  Target Ranges:  Prepandial:   less than 140 mg/dL      Peak postprandial:   less than 180 mg/dL (1-2 hours)      Critically ill patients:  140 - 180 mg/dL   Lab Results  Component Value Date   GLUCAP 140 (H) 07/16/2018   HGBA1C 11.3 (H) 07/15/2018    Review of Glycemic Control Results for Jane Santiago, Jane Santiago (MRN [id number]) as of 07/16/2018 11:12  Ref. Range 07/16/2018 00:05 07/16/2018 05:59 07/16/2018 08:37  Glucose-Capillary Latest Ref Range: 70 - 99 mg/dL 172 (H) 162 (H) 140 (H)   Diabetes history: Type 2 DM Outpatient Diabetes medications: Xultophy 32 units QHS Current orders for Inpatient glycemic control: Xultophy 32 units QHS, Novolog 0-15 units TID  Inpatient Diabetes Program Recommendations:    Currently, blood glucose trends have improved. In agreement with current orders.   Spoke with patient regarding outpatient diabetes management. Patient states, "I was in the middle of a job change and did not have enough money to pay for my prescription. I should not have this issue again. I was without insulin for about 3 weeks."  Reviewed patient's current A1c of 11.3% which is up from 9 %. Explained what a A1c is and what it measures. Also reviewed goal A1c with patient, importance of good glucose control @ home, and blood sugar goals. Reviewed patho of DM, need for insulin, role of pancreas, vascular changes and comorbidites associated from poor glycemic control. Discussed impact of poor control to kidneys and the relationship with recurrent infections.  Patient has a meter and needed supplies. Reports checking CBGs 3 times per day when she had her insulin and that typically blood sugars would run 80-130 mg/dL and denied having lows. Is scheduled for a PCP appointment in Feb. Encouraged to begin checking CBGs again and to follow up if adjustments are needed.  We discussed long  term goals and patient is interested in learning more about diabetes. Will place outpatient referral for education. Provided additional information regarding Relion products and when to call MD, in the event patient is in this situation again. At this time, patient has no further questions at this time.   Thanks, Bronson Curb, MSN, RNC-OB Diabetes Coordinator [phone] (8a-5p)

## 2018-07-16 NOTE — Progress Notes (Addendum)
PROGRESS NOTE        PATIENT DETAILS Name: Jane Santiago Age: 33 y.o. Sex: female Date of Birth: 10-21-1985 Admit Date: 07/15/2018 Admitting Physician Barb Merino, MD [id number], Doreene Burke, FNP  Brief Narrative: Patient is a 33 y.o. female with history of insulin-dependent DM-2, CKD stage IV-ran out of her insulin approximately 1 week prior to this hospital stay-and presented with uncontrolled DM with severe hyperglycemia and AKI on CKD stage IV.  See below for further details  Subjective: Feels better-no longer with polyuria and polydipsia.  Denies any chest pain or shortness of breath.  No vomiting.  Assessment/Plan: Insulin-dependent DM-2 with uncontrolled hyperglycemia: Secondary to noncompliance-ran out of insulin (financial issues) approximately 1 week prior to this hospital stay.  Restarted on home dose insulin insulin on admission-CBGs better and will follow and optimize accordingly.  AKI on CKD stage IV: AKI likely hemodynamically mediated in the setting of uncontrolled hyperglycemia, and lisinopril use (on hold).  Continue to avoid all nephrotoxic agents-creatinine slowly improving with supportive care-but not yet back to baseline.  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  Currently nontoxic-appearing-and without any leukocytosis.  Hypertension: Continue amlodipine and Coreg-continue to hold lisinopril.  Anemia: Likely secondary to CKD-hemoglobin stable-for periodic monitoring in the outpatient setting.  DVT Prophylaxis: Prophylactic Heparin   Code Status: Full code   Family Communication: None at bedside  Disposition Plan: Remain inpatient-hopefully home over the weekend  Antimicrobial  agents: Anti-infectives (From admission, onward)   Start     Dose/Rate Route Frequency Ordered Stop   07/16/18 0000  cefTRIAXone (ROCEPHIN) 1 g in sodium chloride 0.9 % 100 mL IVPB     1 g 200 mL/hr over 30 Minutes Intravenous Every 24 hours 07/15/18 1249     07/15/18 0945  cefTRIAXone (ROCEPHIN) 1 g in sodium chloride 0.9 % 100 mL IVPB     1 g 200 mL/hr over 30 Minutes Intravenous  Once 07/15/18 0944 07/15/18 1123      Procedures: None  CONSULTS:  None  Time spent: 25- minutes-Greater than 50% of this time was spent in counseling, explanation of diagnosis, planning of further management, and coordination of care.  MEDICATIONS: Scheduled Meds: . amLODipine  5 mg Oral Daily  . carvedilol  12.5 mg Oral BID WC  . heparin  5,000 Units Subcutaneous Q8H  . insulin aspart  0-15 Units Subcutaneous TID WC  . Insulin Degludec-Liraglutide  32 Units Subcutaneous QHS  . pantoprazole  40 mg Oral Daily   Continuous Infusions: . sodium chloride 100 mL/hr at 07/16/18 0345  . cefTRIAXone (ROCEPHIN)  IV Stopped (07/15/18 2341)   PRN Meds:.acetaminophen **OR** acetaminophen, promethazine   PHYSICAL EXAM: Vital signs: Vitals:   07/15/18 1646 07/15/18 2040 07/15/18 2234 07/16/18 0602  BP: (!) 168/98 (!) 134/91  (!) 155/104  Pulse: 94 88  (!) 101  Resp: 14 12  12   Temp: 98.9 F (37.2 C) 98.3 F (36.8 C) 98.3 F (36.8 C) 98.1 F (36.7 C)  TempSrc: Oral Oral    SpO2: 99% 100%  100%  Weight:   94.2 kg 92.8 kg  Height: 5\' 1"  (1.549 m)  5' 0.98" (1.549 m)    [employer]  07/15/18 2234 07/16/18 0602  Weight: 94.2 kg 92.8 kg   Body mass index is 38.68 kg/m.   General appearance :Awake, alert, not in any distress. Speech Clear.  Eyes:Pink conjunctiva HEENT: Atraumatic and Normocephalic Neck: supple Resp:Good air entry bilaterally, no added sounds  CVS: S1 S2 regular, no murmurs.  GI: Bowel sounds present, Non tender and not distended with no gaurding, rigidity or rebound.No  organomegaly Extremities: B/L Lower Ext shows no edema, both legs are warm to touch Neurology:  speech clear,Non focal, sensation is grossly intact. Psychiatric: Normal judgment and insight. Alert and oriented x 3. Normal mood. Musculoskeletal:No digital cyanosis Skin:No Rash, warm and dry Wounds:N/A  I have personally reviewed following labs and imaging studies  LABORATORY DATA: CBC: Recent Labs  Lab 07/15/18 0821 07/16/18 0625  WBC 6.3 6.1  NEUTROABS 4.0  --   HGB 9.9* 9.5*  HCT 31.1* 29.5*  MCV 86.1 84.5  PLT 225 967    Basic Metabolic Panel: Recent Labs  Lab 07/15/18 0821 07/16/18 0625  NA 129* 136  K 5.0 4.0  CL 103 110  CO2 20* 19*  GLUCOSE 607* 167*  BUN 36* 31*  CREATININE 3.73* 3.45*  CALCIUM 8.4* 8.2*    GFR: Estimated Creatinine Clearance: 24.3 mL/min (A) (by C-G formula based on SCr of 3.45 mg/dL (H)).  Liver Function Tests: Recent Labs  Lab 07/15/18 0821  AST 11*  ALT 13  ALKPHOS 100  BILITOT 0.5  PROT 6.8  ALBUMIN 2.7*   No results for input(s): LIPASE, AMYLASE in the last 168 hours. No results for input(s): AMMONIA in the last 168 hours.  Coagulation Profile: No results for input(s): INR, PROTIME in the last 168 hours.  Cardiac Enzymes: No results for input(s): CKTOTAL, CKMB, CKMBINDEX, TROPONINI in the last 168 hours.  BNP (last 3 results) No results for input(s): PROBNP in the last 8760 hours.  HbA1C: Recent Labs    07/15/18 0821  HGBA1C 11.3*    CBG: Recent Labs  Lab 07/15/18 1657 07/15/18 2035 07/16/18 0005 07/16/18 0559 07/16/18 0837  GLUCAP 324* 149* 172* 162* 140*    Lipid Profile: No results for input(s): CHOL, HDL, LDLCALC, TRIG, CHOLHDL, LDLDIRECT in the last 72 hours.  Thyroid Function Tests: No results for input(s): TSH, T4TOTAL, FREET4, T3FREE, THYROIDAB in the last 72 hours.  Anemia Panel: No results for input(s): VITAMINB12, FOLATE, FERRITIN, TIBC, IRON, RETICCTPCT in the last 72 hours.  Urine  analysis:    Component Value Date/Time   COLORURINE YELLOW 07/15/2018 0807   APPEARANCEUR HAZY (A) 07/15/2018 0807   LABSPEC 1.015 07/15/2018 0807   PHURINE 6.0 07/15/2018 0807   GLUCOSEU >=500 (A) 07/15/2018 0807   HGBUR SMALL (A) 07/15/2018 0807   BILIRUBINUR NEGATIVE 07/15/2018 0807   BILIRUBINUR neg 06/18/2016 1136   KETONESUR NEGATIVE 07/15/2018 0807   PROTEINUR 100 (A) 07/15/2018 0807   UROBILINOGEN 0.2 06/18/2016 1136   UROBILINOGEN 1.0 05/14/2014 1331   NITRITE NEGATIVE 07/15/2018 0807   LEUKOCYTESUR MODERATE (A) 07/15/2018 0807    Sepsis Labs: Lactic Acid, Venous    Component Value Date/Time   LATICACIDVEN 1.96 05/14/2014 1654    MICROBIOLOGY: Recent Results (from the past 240 hour(s))  Urine culture     Status: Abnormal (Preliminary result)   Collection Time: 07/15/18  9:15 AM  Result Value Ref Range Status   Specimen Description URINE, RANDOM  Final   Special Requests   Final    NONE Performed at [facility], [place]  [address], [place], [place] [address]    Culture >=100,000 COLONIES/mL GRAM NEGATIVE RODS (A)  Final   Report Status PENDING  Incomplete    RADIOLOGY STUDIES/RESULTS: Dg Chest 2 View  Result Date: 07/15/2018 CLINICAL DATA:  Cough.  Hyperglycemia. EXAM: CHEST - 2 VIEW COMPARISON:  Chest radiograph January 04, 2014 and chest CT May 15, 2014 FINDINGS: Lungs are clear. Heart size and pulmonary vascularity are normal. No adenopathy. No bone lesions. IMPRESSION: No edema or consolidation. Electronically Signed   By: Lowella Grip III M.D.   On: 07/15/2018 11:03     LOS: 1 day   Oren Binet, MD  [facility]  If 7PM-7AM, please contact night-coverage  Please page via [email or web address] [email or web address]-click on MD name and type text message  07/16/2018, 10:32 AM

## 2018-07-17 LAB — URINE CULTURE: Culture: >=100000 — AB

## 2018-07-17 LAB — BASIC METABOLIC PANEL
Anion gap: 7 (ref 5–15)
BUN: 26 mg/dL — ABNORMAL HIGH (ref 6–20)
CHLORIDE: 113 mmol/L — AB (ref 98–111)
CO2: 18 mmol/L — ABNORMAL LOW (ref 22–32)
Calcium: 8.5 mg/dL — ABNORMAL LOW (ref 8.9–10.3)
Creatinine, Ser: 3.23 mg/dL — ABNORMAL HIGH (ref 0.44–1.00)
GFR calc Af Amer: 21 mL/min — ABNORMAL LOW (ref >60)
GFR calc non Af Amer: 18 mL/min — ABNORMAL LOW (ref >60)
Glucose, Bld: 204 mg/dL — ABNORMAL HIGH (ref 70–99)
Potassium: 4.3 mmol/L (ref 3.5–5.1)
Sodium: 138 mmol/L (ref 135–145)

## 2018-07-17 LAB — GLUCOSE, CAPILLARY
Glucose-Capillary: 181 mg/dL — ABNORMAL HIGH (ref 70–99)
Glucose-Capillary: 206 mg/dL — ABNORMAL HIGH (ref 70–99)

## 2018-07-17 MED ORDER — AMLODIPINE BESYLATE 5 MG PO TABS: 10.0000 mg | ORAL_TABLET | Freq: Every evening | ORAL | 0 refills | Status: AC

## 2018-07-17 MED ORDER — AMOXICILLIN 500 MG PO CAPS: 500.0000 mg | ORAL_CAPSULE | Freq: Two times a day (BID) | ORAL | 0 refills | Status: DC

## 2018-07-17 NOTE — Discharge Summary (Signed)
PATIENT DETAILS Name: Jane Santiago Age: 33 y.o. Sex: female Date of Birth: 1986/03/27 MRN: [id number]. Admitting Physician: Barb Merino, MD [id number], Doreene Burke, FNP  Admit Date: 07/15/2018 Discharge date: 07/17/2018  Recommendations for Outpatient Follow-up:  1. Follow up with PCP in 1-2 weeks 2. Please obtain BMP/CBC in one week 3. Please ensure follow-up with nephrology  Admitted From:  Home  Disposition: [place]: Non  Equipment/Devices: None  Discharge Condition: Stable  CODE STATUS: FULL CODE  Diet recommendation:  Heart Healthy / Carb Modified   Brief Summary: See H&P, Labs, Consult and Test reports for all details in brief, Patient is a 33 y.o. female with history of insulin-dependent DM-2, CKD stage IV-ran out of her insulin approximately 1 week prior to this hospital stay-and presented with uncontrolled DM with severe hyperglycemia and AKI on CKD stage IV.  See below for further details  Brief Hospital Course: Insulin-dependent DM-2 with uncontrolled hyperglycemia: Secondary to noncompliance-ran out of insulin (financial issues) approximately 1 week prior to this hospital stay.  Restarted on home dose insulin insulin on admission-CBGs stable-follow-up with PCP for further optimization  AKI on CKD stage IV: AKI likely hemodynamically mediated in the setting of uncontrolled hyperglycemia, and lisinopril use (on hold).  Continue to avoid all nephrotoxic agents-creatinine slowly improving with supportive care-not sure what her usual baseline is-claims she has probable stage IV chronic kidney disease but no recent labs, patient does not remember what her last creatinine was.  Since creatinine is improving spontaneously-her volume status is stable-we will continue to hold lisinopril-and have her follow-up with her primary nephrologist within 1 week for repeat blood work.  Suspicion for diabetic nephropathy at baseline causing stage IV CKD.     UTI/pyelonephritis: Markedly better-no flank pain-urine culture positive for pansensitive E. coli-we will discharge on amoxicillin for a few more days.   Hypertension: Continue amlodipine (increased dosage to 10 mg) and Coreg-continue to hold lisinopril.  Anemia: Likely secondary to CKD-hemoglobin stable-for periodic monitoring in the outpatient setting.  Procedures/Studies: None  Discharge Diagnoses:  Principal Problem:   Diabetes mellitus with hyperglycemia ([place]) Active Problems:   Essential hypertension   Acute lower UTI   Acute renal failure (H[facility])   Hyperglycemia   Discharge Instructions:  Activity:  As tolerated with Full fall precautions use walker/cane & assistance as needed   Discharge Instructions    Call MD for:  persistant nausea and vomiting   Complete by:  As directed    Diet - low sodium heart healthy   Complete by:  As directed    Diet Carb Modified   Complete by:  As directed    Discharge instructions   Complete by:  As directed    Follow with Primary MD  Minette Brine, FNP in 1 week  Follow with Dr Hollie Salk in 1 week  Please get a complete blood count and chemistry panel checked by your Primary MD at your next visit, and again as instructed by your Primary MD.  Get Medicines reviewed and adjusted: Please take all your medications with you for your next visit with your Primary MD  Laboratory/radiological data: Please request your Primary MD to go over all hospital tests and procedure/radiological results at the follow up, please ask your Primary MD to get all Hospital records sent to his/her office.  In some cases, they will be blood work, cultures and biopsy results pending at the time of your discharge. Please request that your primary care M.D. follows up on  these results.  Also Note the following: If you experience worsening of your admission symptoms, develop shortness of breath, life threatening emergency, suicidal or homicidal thoughts you  must seek medical attention immediately by calling 911 or calling your MD immediately  if symptoms less severe.  You must read complete instructions/literature along with all the possible adverse reactions/side effects for all the Medicines you take and that have been prescribed to you. Take any new Medicines after you have completely understood and accpet all the possible adverse reactions/side effects.   Do not drive when taking Pain medications or sleeping medications (Benzodaizepines)  Do not take more than prescribed Pain, Sleep and Anxiety Medications. It is not advisable to combine anxiety,sleep and pain medications without talking with your primary care practitioner  Special Instructions: If you have smoked or chewed Tobacco  in the last 2 yrs please stop smoking, stop any regular Alcohol  and or any Recreational drug use.  Wear Seat belts while driving.  Please note: You were cared for by a hospitalist during your hospital stay. Once you are discharged, your primary care physician will handle any further medical issues. Please note that NO REFILLS for any discharge medications will be authorized once you are discharged, as it is imperative that you return to your primary care physician (or establish a relationship with a primary care physician if you do not have one) for your post hospital discharge needs so that they can reassess your need for medications and monitor your lab values.   Increase activity slowly   Complete by:  As directed      Allergies as of 07/17/2018   No Known Allergies     Medication List    STOP taking these medications   lisinopril 20 MG tablet Commonly known as:  PRINIVIL,ZESTRIL     TAKE these medications   amLODipine 5 MG tablet Commonly known as:  NORVASC Take 2 tablets (10 mg total) by mouth every evening. What changed:  how much to take   amoxicillin 500 MG capsule Commonly known as:  AMOXIL Take 1 capsule (500 mg total) by mouth 2 (two)  times daily.   carvedilol 25 MG tablet Commonly known as:  COREG Take 25 mg by mouth 2 (two) times daily with a meal.   furosemide 80 MG tablet Commonly known as:  LASIX Take 80 mg by mouth 2 (two) times daily.   glucose blood test strip Commonly known as:  ACCU-CHEK AVIVA Use as instructed   levonorgestrel 20 MCG/24HR IUD Commonly known as:  MIRENA 1 each by Intrauterine route once.   Magnesium 200 MG Tabs Take 200 mg by mouth every morning.   omeprazole 40 MG capsule Commonly known as:  PRILOSEC Take 40 mg by mouth daily.   XULTOPHY 100-3.6 UNIT-MG/ML Sopn Generic drug:  Insulin Degludec-Liraglutide Inject 32 Units into the skin at bedtime.      Follow-up Information    Minette Brine, FNP. Schedule an appointment as soon as possible for a visit in 1 week(s).   Specialty:  General Practice Contact information: [address] [place] [address] [phone]        Madelon Lips, MD. Schedule an appointment as soon as possible for a visit in 1 week(s).   Specialty:  Nephrology Contact information: [place] [address] [phone]          No Known Allergies  Consultations:   None   Other Procedures/Studies: Dg Chest 2 View  Result Date:  07/15/2018 CLINICAL DATA:  Cough.  Hyperglycemia. EXAM: CHEST - 2 VIEW COMPARISON:  Chest radiograph January 04, 2014 and chest CT May 15, 2014 FINDINGS: Lungs are clear. Heart size and pulmonary vascularity are normal. No adenopathy. No bone lesions. IMPRESSION: No edema or consolidation. Electronically Signed   By: Lowella Grip III M.D.   On: 07/15/2018 11:03      TODAY-DAY OF DISCHARGE:  Subjective:   Rafaela Dinius today has no headache,no chest abdominal pain,no new weakness tingling or numbness, feels much better wants to go home today.   Objective:   Blood pressure (!) 158/96, pulse (!) 105, temperature 99 F (37.2 C), temperature source Oral, resp. rate 18, height 5' 0.98"  (1.549 m), weight 94.1 kg, SpO2 99 %, unknown if currently breastfeeding.  Intake/Output Summary (Last 24 hours) at 07/17/2018 0950 Last data filed at 07/17/2018 0811 Gross per 24 hour  Intake 1096.03 ml  Output -  Net 1096.03 ml   Filed Weights   07/15/18 2234 07/16/18 0602 07/17/18 0500  Weight: 94.2 kg 92.8 kg 94.1 kg    Exam: Awake Alert, Oriented *3, No new F.N deficits, Normal affect [place].AT,PERRAL Supple Neck,No JVD, No cervical lymphadenopathy appriciated.  Symmetrical Chest wall movement, Good air movement bilaterally, CTAB RRR,No Gallops,Rubs or new Murmurs, No Parasternal Heave +ve B.Sounds, Abd Soft, Non tender, No organomegaly appriciated, No rebound -guarding or rigidity. No Cyanosis, Clubbing or edema, No new Rash or bruise   PERTINENT RADIOLOGIC STUDIES: Dg Chest 2 View  Result Date: 07/15/2018 CLINICAL DATA:  Cough.  Hyperglycemia. EXAM: CHEST - 2 VIEW COMPARISON:  Chest radiograph January 04, 2014 and chest CT May 15, 2014 FINDINGS: Lungs are clear. Heart size and pulmonary vascularity are normal. No adenopathy. No bone lesions. IMPRESSION: No edema or consolidation. Electronically Signed   By: Lowella Grip III M.D.   On: 07/15/2018 11:03     PERTINENT LAB RESULTS: CBC: Recent Labs    07/15/18 0821 07/16/18 0625  WBC 6.3 6.1  HGB 9.9* 9.5*  HCT 31.1* 29.5*  PLT 225 218   CMET CMP     Component Value Date/Time   NA 138 07/17/2018 0424   K 4.3 07/17/2018 0424   CL 113 (H) 07/17/2018 0424   CO2 18 (L) 07/17/2018 0424   GLUCOSE 204 (H) 07/17/2018 0424   BUN 26 (H) 07/17/2018 0424   CREATININE 3.23 (H) 07/17/2018 0424   CREATININE 1.09 06/18/2016 1151   CALCIUM 8.5 (L) 07/17/2018 0424   PROT 6.8 07/15/2018 0821   ALBUMIN 2.7 (L) 07/15/2018 0821   AST 11 (L) 07/15/2018 0821   ALT 13 07/15/2018 0821   ALKPHOS 100 07/15/2018 0821   BILITOT 0.5 07/15/2018 0821   GFRNONAA 18 (L) 07/17/2018 0424   GFRNONAA 68 06/18/2016 1151   GFRAA 21 (L)  07/17/2018 0424   GFRAA 79 06/18/2016 1151    GFR Estimated Creatinine Clearance: 26.2 mL/min (A) (by C-G formula based on SCr of 3.23 mg/dL (H)). No results for input(s): LIPASE, AMYLASE in the last 72 hours. No results for input(s): CKTOTAL, CKMB, CKMBINDEX, TROPONINI in the last 72 hours. Invalid input(s): POCBNP No results for input(s): DDIMER in the last 72 hours. Recent Labs    07/15/18 0821  HGBA1C 11.3*   No results for input(s): CHOL, HDL, LDLCALC, TRIG, CHOLHDL, LDLDIRECT in the last 72 hours. No results for input(s): TSH, T4TOTAL, T3FREE, THYROIDAB in the last 72 hours.  Invalid input(s): FREET3 No results for input(s): VITAMINB12, FOLATE, FERRITIN, TIBC, IRON,  RETICCTPCT in the last 72 hours. Coags: No results for input(s): INR in the last 72 hours.  Invalid input(s): PT Microbiology: Recent Results (from the past 240 hour(s))  Urine culture     Status: Abnormal   Collection Time: 07/15/18  9:15 AM  Result Value Ref Range Status   Specimen Description URINE, RANDOM  Final   Special Requests   Final    NONE Performed at [facility], [address] [place], [place] [address]    Culture >=100,000 COLONIES/mL ESCHERICHIA COLI (A)  Final   Report Status 07/17/2018 FINAL  Final   Organism ID, Bacteria ESCHERICHIA COLI (A)  Final      Susceptibility   Escherichia coli - MIC*    AMPICILLIN <=2 SENSITIVE Sensitive     CEFAZOLIN <=4 SENSITIVE Sensitive     CEFTRIAXONE <=1 SENSITIVE Sensitive     CIPROFLOXACIN <=0.25 SENSITIVE Sensitive     GENTAMICIN <=1 SENSITIVE Sensitive     IMIPENEM <=0.25 SENSITIVE Sensitive     NITROFURANTOIN <=16 SENSITIVE Sensitive     TRIMETH/SULFA <=20 SENSITIVE Sensitive     AMPICILLIN/SULBACTAM <=2 SENSITIVE Sensitive     PIP/TAZO <=4 SENSITIVE Sensitive     Extended ESBL NEGATIVE Sensitive     * >=100,000 COLONIES/mL ESCHERICHIA COLI    FURTHER DISCHARGE INSTRUCTIONS:  Get Medicines reviewed and adjusted: Please take all  your medications with you for your next visit with your Primary MD  Laboratory/radiological data: Please request your Primary MD to go over all hospital tests and procedure/radiological results at the follow up, please ask your Primary MD to get all Hospital records sent to his/her office.  In some cases, they will be blood work, cultures and biopsy results pending at the time of your discharge. Please request that your primary care M.D. goes through all the records of your hospital data and follows up on these results.  Also Note the following: If you experience worsening of your admission symptoms, develop shortness of breath, life threatening emergency, suicidal or homicidal thoughts you must seek medical attention immediately by calling 911 or calling your MD immediately  if symptoms less severe.  You must read complete instructions/literature along with all the possible adverse reactions/side effects for all the Medicines you take and that have been prescribed to you. Take any new Medicines after you have completely understood and accpet all the possible adverse reactions/side effects.   Do not drive when taking Pain medications or sleeping medications (Benzodaizepines)  Do not take more than prescribed Pain, Sleep and Anxiety Medications. It is not advisable to combine anxiety,sleep and pain medications without talking with your primary care practitioner  Special Instructions: If you have smoked or chewed Tobacco  in the last 2 yrs please stop smoking, stop any regular Alcohol  and or any Recreational drug use.  Wear Seat belts while driving.  Please note: You were cared for by a hospitalist during your hospital stay. Once you are discharged, your primary care physician will handle any further medical issues. Please note that NO REFILLS for any discharge medications will be authorized once you are discharged, as it is imperative that you return to your primary care physician (or establish  a relationship with a primary care physician if you do not have one) for your post hospital discharge needs so that they can reassess your need for medications and monitor your lab values.  Total Time spent coordinating discharge including counseling, education and face to face time equals 35 minutes.  SignedOren Binet 07/17/2018 9:50 AM

## 2018-07-17 NOTE — Progress Notes (Signed)
Patient was discharged home by MD order; discharged instructions  review and give to patient with care notes; IV DIC; skin intact; patient will be escorted to the car by nurse tech via wheelchair.  

## 2018-07-20 ENCOUNTER — Other Ambulatory Visit: Payer: Self-pay | Admitting: Nurse Practitioner

## 2018-07-22 ENCOUNTER — Telehealth: Payer: Self-pay

## 2018-07-22 NOTE — Telephone Encounter (Signed)
Left pt v/m to call office to see if she is still in the hospital if not we need to schedule her a hospital f/u appt. YRL,RMA

## 2018-07-29 ENCOUNTER — Ambulatory Visit: Payer: Medicaid Other | Admitting: Nurse Practitioner

## 2018-07-29 ENCOUNTER — Encounter: Payer: Self-pay | Admitting: Nurse Practitioner

## 2018-07-29 VITALS — BP 122/78 | HR 88 | Temp 98.7°F | Wt 214.2 lb

## 2018-07-29 DIAGNOSIS — R0609 Other forms of dyspnea: Secondary | ICD-10-CM

## 2018-07-29 DIAGNOSIS — R635 Abnormal weight gain: Secondary | ICD-10-CM | POA: Diagnosis not present

## 2018-07-29 DIAGNOSIS — I129 Hypertensive chronic kidney disease with stage 1 through stage 4 chronic kidney disease, or unspecified chronic kidney disease: Secondary | ICD-10-CM | POA: Diagnosis not present

## 2018-07-29 DIAGNOSIS — Z9119 Patient's noncompliance with other medical treatment and regimen: Secondary | ICD-10-CM

## 2018-07-29 DIAGNOSIS — E049 Nontoxic goiter, unspecified: Secondary | ICD-10-CM

## 2018-07-29 DIAGNOSIS — N184 Chronic kidney disease, stage 4 (severe): Secondary | ICD-10-CM | POA: Diagnosis not present

## 2018-07-29 DIAGNOSIS — E1165 Type 2 diabetes mellitus with hyperglycemia: Secondary | ICD-10-CM

## 2018-07-29 DIAGNOSIS — Z794 Long term (current) use of insulin: Secondary | ICD-10-CM

## 2018-07-29 DIAGNOSIS — J069 Acute upper respiratory infection, unspecified: Secondary | ICD-10-CM

## 2018-07-29 DIAGNOSIS — R600 Localized edema: Secondary | ICD-10-CM

## 2018-07-29 MED ORDER — ALBUTEROL SULFATE HFA 108 (90 BASE) MCG/ACT IN AERS: 2.0000 | INHALATION_SPRAY | Freq: Four times a day (QID) | RESPIRATORY_TRACT | 2 refills | Status: AC | PRN

## 2018-07-29 MED ORDER — AZITHROMYCIN 250 MG PO TABS: ORAL_TABLET | ORAL | 0 refills | Status: AC

## 2018-07-29 NOTE — Progress Notes (Signed)
Subjective:     Patient ID: Jane Santiago , female    DOB: [birth year] , 33 y.o.   MRN: [id number]   Chief Complaint  Patient presents with  . Edema    patient states her legs and ankles are super swollen  . URI    patient states she has been wheezing and has a nonproductive cough, pt is not sure if she has had a fever she has been constantly coughing, nasal congestion    HPI  Diabetes  She presents for her follow-up diabetic visit. She has type 2 diabetes mellitus. Pertinent negatives for hypoglycemia include no confusion, headaches or nervousness/anxiousness. Pertinent negatives for diabetes include no blurred vision, no chest pain, no polydipsia, no polyphagia and no polyuria. Risk factors for coronary artery disease include sedentary lifestyle, obesity, hypertension and stress. Current diabetic treatment includes oral agent (dual therapy). She is compliant with treatment most of the time. Her weight is stable. She is following a generally unhealthy diet. When asked about meal planning, she reported none. She has not had a previous visit with a dietitian. She never participates in exercise. An ACE inhibitor/angiotensin II receptor blocker is being taken. She does not see a podiatrist.Eye exam is not current.  Hypertension  This is a chronic problem. The current episode started more than 1 year ago. The problem is unchanged. The problem is controlled. Pertinent negatives include no blurred vision, chest pain, headaches or malaise/fatigue. Risk factors for coronary artery disease include sedentary lifestyle, obesity and stress. Past treatments include calcium channel blockers. There are no compliance problems.  There is no history of angina. Identifiable causes of hypertension include chronic renal disease (stage IV).  URI   This is a new problem. The current episode started in the past 7 days. The problem has been unchanged. There has been no fever. Associated symptoms include congestion, coughing  (mildly productive) and wheezing. Pertinent negatives include no chest pain, headaches, joint pain or joint swelling. Associated symptoms comments: Dyspnea on exertion. She has tried nothing for the symptoms.     Past Medical History:  Diagnosis Date  . Anemia   . Diabetes mellitus without complication ([place])   . Hypertension   . Tachycardia      Family History  Problem Relation Age of Onset  . Thyroid disease Mother   . Cervical cancer Mother   . Diabetes Father   . Hypertension Brother   . Hypertension Sister   . Valvular heart disease Child      Current Outpatient Medications:  .  amLODipine (NORVASC) 5 MG tablet, Take 2 tablets (10 mg total) by mouth every evening., Disp: 60 tablet, Rfl: 0 .  carvedilol (COREG) 25 MG tablet, Take 25 mg by mouth 2 (two) times daily with a meal. , Disp: , Rfl:  .  furosemide (LASIX) 80 MG tablet, Take 80 mg by mouth 2 (two) times daily., Disp: , Rfl:  .  glucose blood (ACCU-CHEK AVIVA) test strip, Use as instructed, Disp: 100 each, Rfl: 12 .  Insulin Degludec-Liraglutide (XULTOPHY) 100-3.6 UNIT-MG/ML SOPN, Inject 32 Units into the skin at bedtime., Disp: , Rfl:  .  levonorgestrel (MIRENA) 20 MCG/24HR IUD, 1 each by Intrauterine route once., Disp: , Rfl:  .  lisinopril (PRINIVIL,ZESTRIL) 20 MG tablet, TAKE 1 TABLET BY MOUTH EVERY DAY, Disp: 90 tablet, Rfl: 0 .  Magnesium 200 MG TABS, Take 200 mg by mouth every morning., Disp: , Rfl:  .  omeprazole (PRILOSEC) 40 MG capsule, Take 40 mg  by mouth daily. , Disp: , Rfl:    No Known Allergies   Review of Systems  Constitutional: Negative for malaise/fatigue.  HENT: Positive for congestion.   Eyes: Negative for blurred vision.  Respiratory: Positive for cough (mildly productive) and wheezing.   Cardiovascular: Negative for chest pain.  Endocrine: Negative for polydipsia, polyphagia and polyuria.  Musculoskeletal: Negative for joint pain.  Neurological: Negative for headaches.   Psychiatric/Behavioral: Negative for confusion. The patient is not nervous/anxious.      Today's Vitals   07/29/18 1000  BP: 122/78  Pulse: 88  Temp: 98.7 F (37.1 C)  TempSrc: Oral  Weight: 214 lb 3.2 oz (97.2 kg)  PainSc: 0-No pain   Body mass index is 40.49 kg/m.   Objective:  Physical Exam Vitals signs reviewed.  Constitutional:      Appearance: Normal appearance. She is well-developed.  HENT:     Head: Normocephalic and atraumatic.     Nose: Nose normal. No congestion.     Mouth/Throat:     Mouth: Mucous membranes are moist.  Eyes:     Pupils: Pupils are equal, round, and reactive to light.  Neck:     Musculoskeletal: Normal range of motion and neck supple.  Cardiovascular:     Rate and Rhythm: Normal rate and regular rhythm.     Pulses: Normal pulses.     Heart sounds: Normal heart sounds. No murmur.  Pulmonary:     Effort: Pulmonary effort is normal.     Breath sounds: Normal breath sounds.  Chest:     Chest wall: No tenderness.  Musculoskeletal: Normal range of motion.        General: Swelling (bilateral lower extremities) present.  Skin:    General: Skin is warm and dry.     Capillary Refill: Capillary refill takes less than 2 seconds.  Neurological:     General: No focal deficit present.     Mental Status: She is alert and oriented to person, place, and time.       Assessment And Plan:      1. Uncontrolled type 2 diabetes mellitus with hyperglycemia (HCC)  Chronic, poorly controlled  She is advised she should never run out of medications because this causes her blood sugar to increase  Recent hospitalization blood sugar was 604  She is now back on Xultophy  Encouraged to limit intake of sugary foods and drinks  Encouraged to increase physical activity to 150 minutes per week - Lipid panel - CMP14 + Anion Gap - Hemoglobin A1c  2. Stage 4 chronic kidney disease ([place])  She is to follow up with Nephrology in a couple of weeks  During  her hospitalization kidney functions worsened  She was advised to hold the lisinopril  3. Abnormal weight gain  She has had continuous weight gain in the last few days since discharge  Will check BNP  Bilateral lower extremity swelling  4. Edema of both lower extremities  Non pitting edema noted - Brain natriuretic peptide  5. Dyspnea on exertion  Concerning for fluid overload  Will check BNP - Brain natriuretic peptide - albuterol (PROVENTIL HFA;VENTOLIN HFA) 108 (90 Base) MCG/ACT inhaler; Inhale 2 puffs into the lungs every 6 (six) hours as needed for wheezing or shortness of breath.  Dispense: 1 Inhaler; Refill: 2  6. Viral upper respiratory tract infection  Presenting with cold symptoms as well over the last 7 days  Will treat with azithromycin  - azithromycin (ZITHROMAX Z-PAK) 250 MG tablet;  Take 2 tablets (500 mg) on  Day 1,  followed by 1 tablet (250 mg) once daily on Days 2 through 5.  Dispense: 6 each; Refill: 0  7. Goiter  Will recheck thyroid levels  Will consider referral to ENT vs endocrinology - TSH - T3 - T4, Free   Minette Brine, FNP

## 2018-07-30 ENCOUNTER — Other Ambulatory Visit: Payer: Self-pay | Admitting: Nurse Practitioner

## 2018-07-30 DIAGNOSIS — N184 Chronic kidney disease, stage 4 (severe): Secondary | ICD-10-CM

## 2018-07-30 DIAGNOSIS — R7989 Other specified abnormal findings of blood chemistry: Secondary | ICD-10-CM

## 2018-07-30 MED ORDER — HYDRALAZINE HCL 25 MG PO TABS: 25.0000 mg | ORAL_TABLET | Freq: Three times a day (TID) | ORAL | 2 refills | Status: AC

## 2018-07-30 MED ORDER — FUROSEMIDE 40 MG PO TABS: ORAL_TABLET | ORAL | 2 refills | Status: DC

## 2018-07-30 NOTE — Telephone Encounter (Signed)
Spoke with Dr. Hollie Salk and made aware of the patients creatinine and potassium is up to 6.6. she has advised for the patient to stop the Lisinopril, start Hydralazine 25 mg three times per day.  Lasix 120 mg po BID and LoKelma that she can pick up at Dr. Bishop [place] office.  She will need to return on Monday for lab visit CMP.  Called patient to make aware.

## 2018-07-31 ENCOUNTER — Telehealth: Payer: Self-pay | Admitting: Internal Medicine

## 2018-07-31 LAB — CMP14 + ANION GAP
ALT: 19 IU/L (ref 0–32)
AST: 9 IU/L (ref 0–40)
Albumin/Globulin Ratio: 1.1 — ABNORMAL LOW (ref 1.2–2.2)
Albumin: 3.5 g/dL — ABNORMAL LOW (ref 3.8–4.8)
Alkaline Phosphatase: 92 IU/L (ref 39–117)
Anion Gap: 15 mmol/L (ref 10.0–18.0)
BUN/Creatinine Ratio: 14 (ref 9–23)
BUN: 57 mg/dL — ABNORMAL HIGH (ref 6–20)
Bilirubin Total: <0.2 mg/dL (ref 0.0–1.2)
CO2: 16 mmol/L — ABNORMAL LOW (ref 20–29)
Calcium: 9.1 mg/dL (ref 8.7–10.2)
Chloride: 105 mmol/L (ref 96–106)
Creatinine, Ser: 4.15 mg/dL — ABNORMAL HIGH (ref 0.57–1.00)
GFR calc Af Amer: 15 mL/min/{1.73_m2} — ABNORMAL LOW (ref >59)
GFR calc non Af Amer: 13 mL/min/{1.73_m2} — ABNORMAL LOW (ref >59)
Globulin, Total: 3.3 g/dL (ref 1.5–4.5)
Glucose: 138 mg/dL — ABNORMAL HIGH (ref 65–99)
Potassium: 6.6 mmol/L (ref 3.5–5.2)
Sodium: 136 mmol/L (ref 134–144)
Total Protein: 6.8 g/dL (ref 6.0–8.5)

## 2018-07-31 LAB — T3: T3, Total: 80 ng/dL (ref 71–180)

## 2018-07-31 LAB — LIPID PANEL
Chol/HDL Ratio: 4 ratio (ref 0.0–4.4)
Cholesterol, Total: 170 mg/dL (ref 100–199)
HDL: 42 mg/dL (ref >39)
LDL Calculated: 109 mg/dL — ABNORMAL HIGH (ref 0–99)
Triglycerides: 97 mg/dL (ref 0–149)
VLDL Cholesterol Cal: 19 mg/dL (ref 5–40)

## 2018-07-31 LAB — TSH: TSH: 2.6 u[IU]/mL (ref 0.450–4.500)

## 2018-07-31 LAB — PRO B NATRIURETIC PEPTIDE: NT-Pro BNP: 235 pg/mL — ABNORMAL HIGH (ref 0–130)

## 2018-07-31 LAB — HEMOGLOBIN A1C
Est. average glucose Bld gHb Est-mCnc: 252 mg/dL
Hgb A1c MFr Bld: 10.4 % — ABNORMAL HIGH (ref 4.8–5.6)

## 2018-07-31 LAB — T4, FREE: Free T4: 1.22 ng/dL (ref 0.82–1.77)

## 2018-07-31 NOTE — Telephone Encounter (Signed)
Her Potasium is 6.6, needs to avoid foods rich and poatasium like bananas, potatoes, etc. to look it up and take her furosemide at highest dose she has been told to take.

## 2018-08-02 ENCOUNTER — Other Ambulatory Visit: Payer: Self-pay | Admitting: Nurse Practitioner

## 2018-08-02 ENCOUNTER — Other Ambulatory Visit: Payer: Medicaid Other

## 2018-08-02 ENCOUNTER — Encounter: Payer: Self-pay | Admitting: *Deleted

## 2018-08-02 ENCOUNTER — Encounter: Payer: Self-pay | Admitting: Cardiovascular Disease

## 2018-08-02 ENCOUNTER — Ambulatory Visit: Payer: Medicaid Other | Admitting: Cardiovascular Disease

## 2018-08-02 VITALS — BP 121/80 | HR 107 | Ht 61.0 in | Wt 212.4 lb

## 2018-08-02 DIAGNOSIS — R0602 Shortness of breath: Secondary | ICD-10-CM

## 2018-08-02 DIAGNOSIS — R600 Localized edema: Secondary | ICD-10-CM | POA: Diagnosis not present

## 2018-08-02 DIAGNOSIS — N184 Chronic kidney disease, stage 4 (severe): Secondary | ICD-10-CM

## 2018-08-02 DIAGNOSIS — E1165 Type 2 diabetes mellitus with hyperglycemia: Secondary | ICD-10-CM

## 2018-08-02 DIAGNOSIS — R0609 Other forms of dyspnea: Secondary | ICD-10-CM | POA: Diagnosis not present

## 2018-08-02 HISTORY — DX: Shortness of breath: R06.02

## 2018-08-02 HISTORY — DX: Chronic kidney disease, stage 4 (severe): N18.4

## 2018-08-02 LAB — CMP14 + ANION GAP
ALT: 15 IU/L (ref 0–32)
AST: 18 IU/L (ref 0–40)
Albumin/Globulin Ratio: 1.1 — ABNORMAL LOW (ref 1.2–2.2)
Albumin: 3.3 g/dL — ABNORMAL LOW (ref 3.8–4.8)
Alkaline Phosphatase: 87 IU/L (ref 39–117)
Anion Gap: 14 mmol/L (ref 10.0–18.0)
BUN/Creatinine Ratio: 11 (ref 9–23)
BUN: 48 mg/dL — AB (ref 6–20)
Bilirubin Total: <0.2 mg/dL (ref 0.0–1.2)
CO2: 17 mmol/L — ABNORMAL LOW (ref 20–29)
Calcium: 8.7 mg/dL (ref 8.7–10.2)
Chloride: 106 mmol/L (ref 96–106)
Creatinine, Ser: 4.32 mg/dL — ABNORMAL HIGH (ref 0.57–1.00)
GFR calc Af Amer: 15 mL/min/{1.73_m2} — ABNORMAL LOW (ref >59)
GFR calc non Af Amer: 13 mL/min/{1.73_m2} — ABNORMAL LOW (ref >59)
Globulin, Total: 3.1 g/dL (ref 1.5–4.5)
Glucose: 101 mg/dL — ABNORMAL HIGH (ref 65–99)
POTASSIUM: 4.4 mmol/L (ref 3.5–5.2)
Sodium: 137 mmol/L (ref 134–144)
Total Protein: 6.4 g/dL (ref 6.0–8.5)

## 2018-08-02 MED ORDER — INSULIN DEGLUDEC-LIRAGLUTIDE 100-3.6 UNIT-MG/ML ~~LOC~~ SOPN: 20.0000 [IU] | PEN_INJECTOR | Freq: Every day | SUBCUTANEOUS | 3 refills | Status: DC

## 2018-08-02 NOTE — Patient Instructions (Addendum)
Medication Instructions:  Your physician recommends that you continue on your current medications as directed. Please refer to the Current Medication list given to you today.  If you need a refill on your cardiac medications before your next appointment, please call your pharmacy.   Lab work: NONE If you have labs (blood work) drawn today and your tests are completely normal, you will receive your results only by: Marland Kitchen MyChart Message (if you have MyChart) OR . A paper copy in the mail If you have any lab test that is abnormal or we need to change your treatment, we will call you to review the results.  Testing/Procedures: Your physician has requested that you have an echocardiogram. Echocardiography is a painless test that uses sound waves to create images of your heart. It provides your doctor with information about the size and shape of your heart and how well your heart's chambers and valves are working. This procedure takes approximately one hour. There are no restrictions for this procedure.  Your physician has requested that you have a lexiscan myoview. For further information please visit [email or web address]. Please follow instruction sheet, as given.  BOTH OF THE ABOVE WILL BE  DONE AT [place] [address] Follow-Up: At [facility], you and your health needs are our priority.  As part of our continuing mission to provide you with exceptional heart care, we have created designated Provider Care Teams.  These Care Teams include your primary Cardiologist (physician) and Advanced Practice Providers (APPs -  Physician Assistants and Nurse Practitioners) who all work together to provide you with the care you need, when you need it. You will need a follow up appointment in 3 months. You may see DR [facility]  or one of the following Advanced Practice Providers on your designated Care Team:   Kerin Ransom, PA-C Roby Lofts, [place] . Sande Rives, PA-C  Any Other  Special Instructions Will Be Listed Below (If Applicable).   Echocardiogram An echocardiogram is a procedure that uses painless sound waves (ultrasound) to produce an image of the heart. Images from an echocardiogram can provide important information about:  Signs of coronary artery disease (CAD).  Aneurysm detection. An aneurysm is a weak or damaged part of an artery wall that bulges out from the normal force of blood pumping through the body.  Heart size and shape. Changes in the size or shape of the heart can be associated with certain conditions, including heart failure, aneurysm, and CAD.  Heart muscle function.  Heart valve function.  Signs of a past heart attack.  Fluid buildup around the heart.  Thickening of the heart muscle.  A tumor or infectious growth around the heart valves. Tell a health care provider about:  Any allergies you have.  All medicines you are taking, including vitamins, herbs, eye drops, creams, and over-the-counter medicines.  Any blood disorders you have.  Any surgeries you have had.  Any medical conditions you have.  Whether you are pregnant or may be pregnant. What are the risks? Generally, this is a safe procedure. However, problems may occur, including:  Allergic reaction to dye (contrast) that may be used during the procedure. What happens before the procedure? No specific preparation is needed. You may eat and drink normally. What happens during the procedure?   An IV tube may be inserted into one of your veins.  You may receive contrast through this tube. A contrast is an injection that improves the quality of the pictures  from your heart.  A gel will be applied to your chest.  A wand-like tool (transducer) will be moved over your chest. The gel will help to transmit the sound waves from the transducer.  The sound waves will harmlessly bounce off of your heart to allow the heart images to be captured in real-time motion. The  images will be recorded on a computer. The procedure may vary among health care providers and hospitals. What happens after the procedure?  You may return to your normal, everyday life, including diet, activities, and medicines, unless your health care provider tells you not to do that. Summary  An echocardiogram is a procedure that uses painless sound waves (ultrasound) to produce an image of the heart.  Images from an echocardiogram can provide important information about the size and shape of your heart, heart muscle function, heart valve function, and fluid buildup around your heart.  You do not need to do anything to prepare before this procedure. You may eat and drink normally.  After the echocardiogram is completed, you may return to your normal, everyday life, unless your health care provider tells you not to do that. This information is not intended to replace advice given to you by your health care provider. Make sure you discuss any questions you have with your health care provider. Document Released: 06/20/2000 Document Revised: 07/26/2016 Document Reviewed: 07/26/2016 Elsevier Interactive Patient Education  2019 [employer].   Cardiac Nuclear Scan A cardiac nuclear scan is a test that is done to check the flow of blood to your heart. It is done when you are resting and when you are exercising. The test looks for problems such as:  Not enough blood reaching a portion of the heart.  The heart muscle not working as it should. You may need this test if:  You have heart disease.  You have had lab results that are not normal.  You have had heart surgery or a balloon procedure to open up blocked arteries (angioplasty).  You have chest pain.  You have shortness of breath. In this test, a special dye (tracer) is put into your bloodstream. The tracer will travel to your heart. A camera will then take pictures of your heart to see how the tracer moves through your heart. This  test is usually done at a hospital and takes 2-4 hours. Tell a doctor about:  Any allergies you have.  All medicines you are taking, including vitamins, herbs, eye drops, creams, and over-the-counter medicines.  Any problems you or family members have had with anesthetic medicines.  Any blood disorders you have.  Any surgeries you have had.  Any medical conditions you have.  Whether you are pregnant or may be pregnant. What are the risks? Generally, this is a safe test. However, problems may occur, such as:  Serious chest pain and heart attack. This is only a risk if the stress portion of the test is done.  Rapid heartbeat.  A feeling of warmth in your chest. This feeling usually does not last long.  Allergic reaction to the tracer. What happens before the test?  Ask your doctor about changing or stopping your normal medicines. This is important.  Follow instructions from your doctor about what you cannot eat or drink.  Remove your jewelry on the day of the test. What happens during the test?  An IV tube will be inserted into one of your veins.  Your doctor will give you a small amount of tracer through  the IV tube.  You will wait for 20-40 minutes while the tracer moves through your bloodstream.  Your heart will be monitored with an electrocardiogram (ECG).  You will lie down on an exam table.  Pictures of your heart will be taken for about 15-20 minutes.  You may also have a stress test. For this test, one of these things may be done: ? You will be asked to exercise on a treadmill or a stationary bike. ? You will be given medicines that will make your heart work harder. This is done if you are unable to exercise.  When blood flow to your heart has peaked, a tracer will again be given through the IV tube.  After 20-40 minutes, you will get back on the exam table. More pictures will be taken of your heart.  Depending on the tracer that is used, more pictures may  need to be taken 3-4 hours later.  Your IV tube will be removed when the test is over. The test may vary among doctors and hospitals. What happens after the test?  Ask your doctor: ? Whether you can return to your normal schedule, including diet, activities, and medicines. ? Whether you should drink more fluids. This will help to remove the tracer from your body. Drink enough fluid to keep your pee (urine) pale yellow.  Ask your doctor, or the department that is doing the test: ? When will my results be ready? ? How will I get my results? Summary  A cardiac nuclear scan is a test that is done to check the flow of blood to your heart.  Tell your doctor whether you are pregnant or may be pregnant.  Before the test, ask your doctor about changing or stopping your normal medicines. This is important.  Ask your doctor whether you can return to your normal activities. You may be asked to drink more fluids. This information is not intended to replace advice given to you by your health care provider. Make sure you discuss any questions you have with your health care provider. Document Released: 12/07/2017 Document Revised: 12/07/2017 Document Reviewed: 12/07/2017 Elsevier Interactive Patient Education  2019 [employer].

## 2018-08-02 NOTE — Progress Notes (Signed)
Cardiology Office Note   Date:  08/02/2018   ID:  Jane Santiago, DOB 27-Nov-1985, MRN [id number]  PCP:  Jane Brine, FNP  Cardiologist:   Jane Latch, MD   No chief complaint on file.    History of Present Illness: Jane Santiago is a 33 y.o. female with diabetes, hypertension, and CKD IV here for follow up.  She was initially seen 03/2017 for an evaluation of lower extremity edema.  At the time she had no shortness of breath, orthopnea, or PND. She initially followed up with her primary care and was started on hydrochlorothiazide, which has helped somewhat. 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 She does limit the salt in her diet. Jane Santiago was initially diagnosed with hypertension in 2012 at the time of her pregnancy. She has been diabetic since age 47.  She had an echo 03/2017 that revealed LVEF 55-60% with normal diastolic function.  Her IVC was not dilated.  Since she was last seen she was diagnosed with renal failure.  She was recently hospitalized due to hyperglycemia.  Her diabetes has been poorly-controlled lately and her renal failure is thought to be due to her diabetes.  She is followed by Jane Santiago.  Her furosemide was recently increased because she now has worsening lower extremity edema.  She is taking 120 mg of Lasix twice daily.  She has no chest pain but does get very short of breath with exertion.  She also reports orthopnea.  She wears compression stockings regularly and this is not helping.  She has been limiting her fluid intake to 64 ounces daily and cooks her meals at home.  She limits her salt intake.   Past Medical History:  Diagnosis Date  . Anemia   . CKD (chronic kidney disease) stage 4, GFR 15-29 ml/min (HCC) 08/02/2018  . Diabetes mellitus without complication ([place])   . Hypertension   . Shortness of breath 08/02/2018  . Tachycardia     Past Surgical History:  Procedure Laterality Date  . CESAREAN SECTION     x2     Current Outpatient  Medications  Medication Sig Dispense Refill  . albuterol (PROVENTIL HFA;VENTOLIN HFA) 108 (90 Base) MCG/ACT inhaler Inhale 2 puffs into the lungs every 6 (six) hours as needed for wheezing or shortness of breath. 1 Inhaler 2  . amLODipine (NORVASC) 5 MG tablet Take 2 tablets (10 mg total) by mouth every evening. 60 tablet 0  . azithromycin (ZITHROMAX Z-PAK) 250 MG tablet Take 2 tablets (500 mg) on  Day 1,  followed by 1 tablet (250 mg) once daily on Days 2 through 5. 6 each 0  . carvedilol (COREG) 25 MG tablet Take 25 mg by mouth 2 (two) times daily with a meal.     . furosemide (LASIX) 40 MG tablet Take one tab by mouth with the 80mg  to total 120mg  BID 60 tablet 2  . furosemide (LASIX) 80 MG tablet Take 80 mg by mouth 2 (two) times daily.    Marland Kitchen glucose blood (ACCU-CHEK AVIVA) test strip Use as instructed 100 each 12  . hydrALAZINE (APRESOLINE) 25 MG tablet Take 1 tablet (25 mg total) by mouth 3 (three) times daily. 120 tablet 2  . levonorgestrel (MIRENA) 20 MCG/24HR IUD 1 each by Intrauterine route once.    . Magnesium 200 MG TABS Take 200 mg by mouth every morning.    Marland Kitchen omeprazole (PRILOSEC) 40 MG capsule Take 40 mg by  mouth daily.     . Insulin Degludec-Liraglutide (XULTOPHY) 100-3.6 UNIT-MG/ML SOPN Inject 20 Units into the skin daily. 15 mL 3   No current facility-administered medications for this visit.     Allergies:   Patient has no known allergies.    Social History:  The patient  reports that she has never smoked. She has never used smokeless tobacco. She reports that she does not drink alcohol or use drugs.   Family History:  The patient's family history includes Cervical cancer in her mother; Diabetes in her father; Hypertension in her brother and sister; Thyroid disease in her mother; Valvular heart disease in her child.    ROS:  Please see the history of present illness.   Otherwise, review of systems are positive for none.   All other systems are reviewed and negative.     PHYSICAL EXAM: VS:  BP 121/80   Pulse (!) 107   Ht 5\' 1"  (1.549 m)   Wt 212 lb 6.4 oz (96.3 kg)   SpO2 100%   BMI 40.13 kg/m  , BMI Body mass index is 40.13 kg/m. GENERAL:  Well appearing HEENT: Pupils equal round and reactive, fundi not visualized, oral mucosa unremarkable NECK:  JVP 1cm above clavicle at 45 degrees. Waveform within normal limits, carotid upstroke brisk and symmetric, no bruits, no thyromegaly LYMPHATICS:  No cervical adenopathy LUNGS:  Clear to auscultation bilaterally HEART:  RRR.  PMI not displaced or sustained,S1 and S2 within normal limits, no S3, no S4, no clicks, no rubs,  murmurs ABD:  Flat, positive bowel sounds normal in frequency in pitch, no bruits, no rebound, no guarding, no midline pulsatile mass, no hepatomegaly, no splenomegaly EXT:  2 plus pulses throughout, 2+ LE edema to above ankles bilaterally.  No cyanosis no clubbing SKIN:  No rashes no nodules NEURO:  Cranial nerves II through XII grossly intact, motor grossly intact throughout PSYCH:  Cognitively intact, oriented to person place and time   EKG:  EKG is not ordered today. The ekg ordered today demonstrates sinus rhythm.  Rate 98 bpm.   03/09/17: Sinus rhythm. Rate 91 bpm.  Echo 03/16/17: Study Conclusions  - Left ventricle: The cavity size was normal. Posterior wall   thickness was increased in a pattern of mild LVH. Systolic   function was normal. The estimated ejection fraction was in the   range of 55% to 60%. Wall motion was normal; there were no   regional wall motion abnormalities. Left ventricular diastolic   function parameters were normal. - Aortic valve: Transvalvular velocity was within the normal range.   There was no stenosis. There was no regurgitation. - Mitral valve: Transvalvular velocity was within the normal range.   There was no evidence for stenosis. There was trivial   regurgitation. - Right ventricle: The cavity size was normal. Wall thickness was   normal.  Systolic function was normal. - Tricuspid valve: There was trivial regurgitation. - Pulmonary arteries: Systolic pressure was within the normal   range. PA peak pressure: 25 mm Hg (S).  Recent Labs: 07/16/2018: Hemoglobin 9.5; Platelets 218 07/29/2018: ALT 19; BUN 57; Creatinine, Ser 4.15; NT-Pro BNP 235; Potassium 6.6; Sodium 136; TSH 2.600    Lipid Panel    Component Value Date/Time   CHOL 170 07/29/2018 1109   TRIG 97 07/29/2018 1109   HDL 42 07/29/2018 1109   CHOLHDL 4.0 07/29/2018 1109   CHOLHDL 4.4 06/18/2016 1225   VLDL 24 06/18/2016 1225   LDLCALC 109 (H)  07/29/2018 1109   02/19/17: Total cholesterol 2:15, triglycerides 88, HDL 57, LDL 140 Hemoglobin A1c 8.2% Sodium 143, potassium 4.7, BUN 17, creatinine 1.3    Wt Readings from Last 3 Encounters:  08/02/18 212 lb 6.4 oz (96.3 kg)  07/29/18 214 lb 3.2 oz (97.2 kg)  07/17/18 207 lb 7.3 oz (94.1 kg)      ASSESSMENT AND PLAN:  # LE Edema:   # Shortness of breath: Edema is now worse and she has shortness of breath.  Echo was previously unremarkable.  However, she now has CKD.  It will be helpful to repeat her echo and get a Lexiscan Myoview to assess for ischemia given her longstanding history of poorly controlled diabetes and her hypertension.  # Hypertension: Controlled on amlodipine, lasix, carvedilol, and hydralazine.   Current medicines are reviewed at length with the patient today.  The patient does not have concerns regarding medicines.  The following changes have been made:  no change  Labs/ tests ordered today include:   Orders Placed This Encounter  Procedures  . MYOCARDIAL PERFUSION IMAGING  . ECHOCARDIOGRAM COMPLETE     Disposition:   FU with Donielle Kaigler C. Oval Linsey, MD, [facility] in 3 months.       Signed, Taron Mondor C. Oval Linsey, MD, [facility]  08/02/2018 6:08 PM    [place]

## 2018-08-03 ENCOUNTER — Telehealth: Payer: Self-pay

## 2018-08-03 ENCOUNTER — Other Ambulatory Visit: Payer: Self-pay

## 2018-08-03 ENCOUNTER — Emergency Department (HOSPITAL_COMMUNITY)
Admission: EM | Admit: 2018-08-03 | Discharge: 2018-08-03 | Disposition: A | Discharge status: Home / Self Care | Payer: Medicaid Other | Attending: Emergency Medicine | Admitting: Emergency Medicine

## 2018-08-03 ENCOUNTER — Encounter: Payer: Self-pay | Admitting: Nurse Practitioner

## 2018-08-03 ENCOUNTER — Encounter (HOSPITAL_COMMUNITY): Payer: Self-pay

## 2018-08-03 ENCOUNTER — Emergency Department (HOSPITAL_COMMUNITY)
Admission: EM | Admit: 2018-08-03 | Discharge: 2018-08-03 | Disposition: A | Discharge status: Home / Self Care | Payer: Medicaid Other | Source: Home / Self Care

## 2018-08-03 DIAGNOSIS — Z5321 Procedure and treatment not carried out due to patient leaving prior to being seen by health care provider: Secondary | ICD-10-CM | POA: Insufficient documentation

## 2018-08-03 DIAGNOSIS — R1110 Vomiting, unspecified: Secondary | ICD-10-CM | POA: Insufficient documentation

## 2018-08-03 DIAGNOSIS — R51 Headache: Secondary | ICD-10-CM | POA: Insufficient documentation

## 2018-08-03 LAB — CBC
HCT: 31 % — ABNORMAL LOW (ref 36.0–46.0)
Hemoglobin: 9.6 g/dL — ABNORMAL LOW (ref 12.0–15.0)
MCH: 27.3 pg (ref 26.0–34.0)
MCHC: 31 g/dL (ref 30.0–36.0)
MCV: 88.1 fL (ref 80.0–100.0)
Platelets: 229 10*3/uL (ref 150–400)
RBC: 3.52 MIL/uL — ABNORMAL LOW (ref 3.87–5.11)
RDW: 12.1 % (ref 11.5–15.5)
WBC: 7.4 10*3/uL (ref 4.0–10.5)
nRBC: 0 % (ref 0.0–0.2)

## 2018-08-03 LAB — COMPREHENSIVE METABOLIC PANEL
ALK PHOS: 70 U/L (ref 38–126)
ALT: 20 U/L (ref 0–44)
ANION GAP: 7 (ref 5–15)
AST: 17 U/L (ref 15–41)
Albumin: 2.9 g/dL — ABNORMAL LOW (ref 3.5–5.0)
BUN: 44 mg/dL — ABNORMAL HIGH (ref 6–20)
CO2: 22 mmol/L (ref 22–32)
Calcium: 8.6 mg/dL — ABNORMAL LOW (ref 8.9–10.3)
Chloride: 110 mmol/L (ref 98–111)
Creatinine, Ser: 3.98 mg/dL — ABNORMAL HIGH (ref 0.44–1.00)
GFR calc Af Amer: 16 mL/min — ABNORMAL LOW (ref >60)
GFR calc non Af Amer: 14 mL/min — ABNORMAL LOW (ref >60)
GLUCOSE: 121 mg/dL — AB (ref 70–99)
Potassium: 4.5 mmol/L (ref 3.5–5.1)
Sodium: 139 mmol/L (ref 135–145)
Total Bilirubin: 0.4 mg/dL (ref 0.3–1.2)
Total Protein: 6.8 g/dL (ref 6.5–8.1)

## 2018-08-03 LAB — I-STAT BETA HCG BLOOD, ED (MC, WL, AP ONLY): I-stat hCG, quantitative: <5 m[IU]/mL (ref <5)

## 2018-08-03 LAB — LIPASE, BLOOD: Lipase: 35 U/L (ref 11–51)

## 2018-08-03 NOTE — Telephone Encounter (Signed)
Called and spoke with pt to see how she is feeling she stated she has no energy she has been up since 4am with nausea and vomiting she also stated she has a slight headache. I asked pt when is her appointment with Dr.Upton she stated it is on tomorrow 01/29. I advised pt per Minette Brine FNP-BC that she needs to go to the ER. YRL,RMA

## 2018-08-03 NOTE — ED Triage Notes (Addendum)
Patient was given a z pack yesterday and patient thinks she is having a reaction to the med because she is having symptoms  Of headache, vomiting, and feeling weak.  Patient was seen at [facility] ED today and had lab work completed. Patient left due to wait times.

## 2018-08-03 NOTE — ED Triage Notes (Signed)
Pt was given zpack for flu sx yesterday and pt thinks she is having a reaction to it, pt has been having headache and vomiting since this morning. VSS. Checked her cbg this morning and it was 140.

## 2018-08-05 ENCOUNTER — Telehealth: Payer: Self-pay | Admitting: Cardiovascular Disease

## 2018-08-05 ENCOUNTER — Emergency Department (HOSPITAL_COMMUNITY)
Admission: EM | Admit: 2018-08-05 | Discharge: 2018-08-05 | Disposition: A | Discharge status: Home / Self Care | Payer: Medicaid Other | Attending: Emergency Medicine | Admitting: Emergency Medicine

## 2018-08-05 ENCOUNTER — Emergency Department (HOSPITAL_COMMUNITY): Payer: Medicaid Other

## 2018-08-05 ENCOUNTER — Telehealth (HOSPITAL_COMMUNITY): Payer: Self-pay | Admitting: *Deleted

## 2018-08-05 ENCOUNTER — Encounter (HOSPITAL_COMMUNITY): Payer: Self-pay

## 2018-08-05 ENCOUNTER — Other Ambulatory Visit: Payer: Self-pay

## 2018-08-05 ENCOUNTER — Encounter (HOSPITAL_COMMUNITY): Payer: Self-pay | Admitting: *Deleted

## 2018-08-05 DIAGNOSIS — Z79899 Other long term (current) drug therapy: Secondary | ICD-10-CM | POA: Diagnosis not present

## 2018-08-05 DIAGNOSIS — I129 Hypertensive chronic kidney disease with stage 1 through stage 4 chronic kidney disease, or unspecified chronic kidney disease: Secondary | ICD-10-CM | POA: Diagnosis not present

## 2018-08-05 DIAGNOSIS — N184 Chronic kidney disease, stage 4 (severe): Secondary | ICD-10-CM | POA: Diagnosis not present

## 2018-08-05 DIAGNOSIS — R531 Weakness: Secondary | ICD-10-CM | POA: Diagnosis present

## 2018-08-05 DIAGNOSIS — Z3202 Encounter for pregnancy test, result negative: Secondary | ICD-10-CM | POA: Diagnosis not present

## 2018-08-05 DIAGNOSIS — E1122 Type 2 diabetes mellitus with diabetic chronic kidney disease: Secondary | ICD-10-CM | POA: Insufficient documentation

## 2018-08-05 LAB — CBC WITH DIFFERENTIAL/PLATELET
Abs Immature Granulocytes: 0.02 10*3/uL (ref 0.00–0.07)
BASOS PCT: 0 %
Basophils Absolute: 0 10*3/uL (ref 0.0–0.1)
EOS ABS: 0.1 10*3/uL (ref 0.0–0.5)
EOS PCT: 1 %
HCT: 31.2 % — ABNORMAL LOW (ref 36.0–46.0)
Hemoglobin: 9.5 g/dL — ABNORMAL LOW (ref 12.0–15.0)
Immature Granulocytes: 0 %
Lymphocytes Relative: 25 %
Lymphs Abs: 1.9 10*3/uL (ref 0.7–4.0)
MCH: 28 pg (ref 26.0–34.0)
MCHC: 30.4 g/dL (ref 30.0–36.0)
MCV: 92 fL (ref 80.0–100.0)
Monocytes Absolute: 0.5 10*3/uL (ref 0.1–1.0)
Monocytes Relative: 7 %
Neutro Abs: 5.2 10*3/uL (ref 1.7–7.7)
Neutrophils Relative %: 67 %
PLATELETS: 209 10*3/uL (ref 150–400)
RBC: 3.39 MIL/uL — ABNORMAL LOW (ref 3.87–5.11)
RDW: 12.2 % (ref 11.5–15.5)
WBC: 7.7 10*3/uL (ref 4.0–10.5)
nRBC: 0 % (ref 0.0–0.2)

## 2018-08-05 LAB — URINALYSIS, ROUTINE W REFLEX MICROSCOPIC
Bilirubin Urine: NEGATIVE
Glucose, UA: NEGATIVE mg/dL
Hgb urine dipstick: NEGATIVE
Ketones, ur: NEGATIVE mg/dL
Leukocytes, UA: NEGATIVE
Nitrite: NEGATIVE
Protein, ur: 100 mg/dL — AB
Specific Gravity, Urine: 1.006 (ref 1.005–1.030)
pH: 7 (ref 5.0–8.0)

## 2018-08-05 LAB — COMPREHENSIVE METABOLIC PANEL
ALT: 16 U/L (ref 0–44)
AST: 15 U/L (ref 15–41)
Albumin: 3.2 g/dL — ABNORMAL LOW (ref 3.5–5.0)
Alkaline Phosphatase: 69 U/L (ref 38–126)
Anion gap: 9 (ref 5–15)
BILIRUBIN TOTAL: 0.3 mg/dL (ref 0.3–1.2)
BUN: 40 mg/dL — ABNORMAL HIGH (ref 6–20)
CO2: 20 mmol/L — ABNORMAL LOW (ref 22–32)
CREATININE: 3.84 mg/dL — AB (ref 0.44–1.00)
Calcium: 8.3 mg/dL — ABNORMAL LOW (ref 8.9–10.3)
Chloride: 106 mmol/L (ref 98–111)
GFR calc Af Amer: 17 mL/min — ABNORMAL LOW (ref >60)
GFR calc non Af Amer: 15 mL/min — ABNORMAL LOW (ref >60)
Glucose, Bld: 97 mg/dL (ref 70–99)
Potassium: 4.6 mmol/L (ref 3.5–5.1)
Sodium: 135 mmol/L (ref 135–145)
Total Protein: 7 g/dL (ref 6.5–8.1)

## 2018-08-05 LAB — PREGNANCY, URINE: PREG TEST UR: NEGATIVE

## 2018-08-05 NOTE — ED Provider Notes (Signed)
[place] DEPT Provider Note   CSN: [id number] Arrival date & time: 08/05/18  7989     History   Chief Complaint Chief Complaint  Patient presents with  . Emesis  . Fatigue    HPI Jane Santiago is a 33 y.o. female.  HPI With multiple medical issues including chronic kidney disease, hypertension, diabetes presents with fatigue, after an episode of nausea, vomiting that occurred 2 days ago. Patient notes that within the past week she had URI-like illness, took the first dose of azithromycin, but soon thereafter felt nauseous. She had 1 day of nausea, with several episodes of vomiting, but no diarrhea, no substantial pain, no fever. This occurred 2 days ago, and since that time she has had persistent fatigue, though no additional nausea, anorexia, vomiting, diarrhea, fever. Today after [place] me that with her physician, she was sent here for evaluation.  Past Medical History:  Diagnosis Date  . Anemia   . CKD (chronic kidney disease) stage 4, GFR 15-29 ml/min (HCC) 08/02/2018  . Diabetes mellitus without complication ([place])   . Hypertension   . Shortness of breath 08/02/2018  . Tachycardia     Patient Active Problem List   Diagnosis Date Noted  . Shortness of breath 08/02/2018  . CKD (chronic kidney disease) stage 4, GFR 15-29 ml/min (HCC) 08/02/2018  . Acute lower UTI 07/15/2018  . Acute renal failure ([place]) 07/15/2018  . Hyperglycemia 07/15/2018  . Multinodular goiter 06/08/2015  . Essential hypertension 05/19/2014  . Leukocytosis 05/14/2014  . Right flank pain 05/14/2014  . Anemia 05/14/2014  . Diabetes mellitus with hyperglycemia ([place]) 05/14/2014  . Diabetes in pregnancy 04/08/2011    Past Surgical History:  Procedure Laterality Date  . CESAREAN SECTION     x2     OB History    Gravida  5   Para  1   Term  1   Preterm  0   AB  3   Living  1     SAB  3   TAB  0   Ectopic  0   Multiple  0   Live Births              Home Medications    Prior to Admission medications   Medication Sig Start Date End Date Taking? Authorizing Provider  acetaminophen (TYLENOL) 325 MG tablet Take 650 mg by mouth every 6 (six) hours as needed for mild pain or headache.   Yes [provider]  albuterol (PROVENTIL HFA;VENTOLIN HFA) 108 (90 Base) MCG/ACT inhaler Inhale 2 puffs into the lungs every 6 (six) hours as needed for wheezing or shortness of breath. 07/29/18  Yes Minette Brine, FNP  amLODipine (NORVASC) 5 MG tablet Take 2 tablets (10 mg total) by mouth every evening. 07/17/18  Yes Ghimire, Henreitta Leber, MD  carvedilol (COREG) 25 MG tablet Take 25 mg by mouth 2 (two) times daily with a meal.    Yes [provider]  furosemide (LASIX) 80 MG tablet Take 80 mg by mouth 2 (two) times daily.   Yes [provider]  hydrALAZINE (APRESOLINE) 25 MG tablet Take 1 tablet (25 mg total) by mouth 3 (three) times daily. 07/30/18 07/30/19 Yes Minette Brine, FNP  Insulin Degludec-Liraglutide (XULTOPHY) 100-3.6 UNIT-MG/ML SOPN Inject 20 Units into the skin daily. Patient taking differently: Inject 32 Units into the skin daily.  08/02/18  Yes Minette Brine, FNP  levonorgestrel (MIRENA) 20 MCG/24HR IUD 1 each by Intrauterine route once.  Yes [provider]  Magnesium 200 MG TABS Take 200 mg by mouth every morning.   Yes [provider]  omeprazole (PRILOSEC) 40 MG capsule Take 40 mg by mouth daily.    Yes [provider]  furosemide (LASIX) 40 MG tablet Take one tab by mouth with the 80mg  to total 120mg  BID Patient not taking: Reported on 08/05/2018 07/30/18   Minette Brine, FNP  glucose blood (ACCU-CHEK AVIVA) test strip Use as instructed 06/18/16   Alfonse Spruce, FNP    Family History Family History  Problem Relation Age of Onset  . Thyroid disease Mother   . Cervical cancer Mother   . Diabetes Father   . Hypertension Brother   . Hypertension Sister   . Valvular heart  disease Child     Social History Social History   Tobacco Use  . Smoking status: Never Smoker  . Smokeless tobacco: Never Used  Substance Use Topics  . Alcohol use: No  . Drug use: No     Allergies   Patient has no known allergies.   Review of Systems Review of Systems  Constitutional:       Per HPI, otherwise negative  HENT:       Per HPI, otherwise negative  Respiratory:       Per HPI, otherwise negative  Cardiovascular:       Per HPI, otherwise negative  Gastrointestinal: Positive for nausea and vomiting. Negative for abdominal pain.  Endocrine:       Negative aside from HPI  Genitourinary:       Neg aside from HPI   Musculoskeletal:       Per HPI, otherwise negative  Skin: Negative.   Neurological: Negative for syncope.     Physical Exam Updated Vital Signs BP 127/85   Pulse 99   Temp 98.8 F (37.1 C) (Oral)   Resp 17   Ht 5\' 1"  (1.549 m)   Wt 97.1 kg   SpO2 100%   BMI 40.43 kg/m   Physical Exam Vitals signs and nursing note reviewed.  Constitutional:      General: She is not in acute distress.    Appearance: She is well-developed.  HENT:     Head: Normocephalic and atraumatic.  Eyes:     Conjunctiva/sclera: Conjunctivae normal.  Cardiovascular:     Rate and Rhythm: Normal rate and regular rhythm.  Pulmonary:     Effort: Pulmonary effort is normal. No respiratory distress.     Breath sounds: Normal breath sounds. No stridor.  Abdominal:     General: There is no distension.     Tenderness: There is no abdominal tenderness. There is no guarding or rebound.  Skin:    General: Skin is warm and dry.  Neurological:     Mental Status: She is alert and oriented to person, place, and time.     Cranial Nerves: No cranial nerve deficit.      ED Treatments / Results  Labs (all labs ordered are listed, but only abnormal results are displayed) Labs Reviewed  COMPREHENSIVE METABOLIC PANEL - Abnormal; Notable for the following components:       Result Value   CO2 20 (*)    BUN 40 (*)    Creatinine, Ser 3.84 (*)    Calcium 8.3 (*)    Albumin 3.2 (*)    GFR calc non Af Amer 15 (*)    GFR calc Af Amer 17 (*)    All other components within  normal limits  CBC WITH DIFFERENTIAL/PLATELET - Abnormal; Notable for the following components:   RBC 3.39 (*)    Hemoglobin 9.5 (*)    HCT 31.2 (*)    All other components within normal limits  URINALYSIS, ROUTINE W REFLEX MICROSCOPIC - Abnormal; Notable for the following components:   Color, Urine STRAW (*)    Protein, ur 100 (*)    Bacteria, UA RARE (*)    All other components within normal limits  PREGNANCY, URINE    Radiology Dg Chest 2 View  Result Date: 08/05/2018 CLINICAL DATA:  Nausea, vomiting, ongoing weakness, fluid retention, history renal failure, diabetes mellitus, hypertension EXAM: CHEST - 2 VIEW COMPARISON:  07/15/2018 FINDINGS: Normal heart size, mediastinal contours, and pulmonary vascularity. Minimal basilar atelectasis on lateral view posteriorly. No pulmonary infiltrate pleural effusion or pneumothorax. Bones unremarkable. IMPRESSION: Minimal basilar atelectasis. Electronically Signed   By: Lavonia Dana M.D.   On: 08/05/2018 11:31    Procedures Procedures (including critical care time)  Medications Ordered in ED Medications - No data to display   Initial Impression / Assessment and Plan / ED Course  I have reviewed the triage vital signs and the nursing notes.  Pertinent labs & imaging results that were available during my care of the patient were reviewed by me and considered in my medical decision making (see chart for details).     1:43 PM Repeat exam patient is awake, alert, in no distress, hemodynamically unremarkable. We discussed all findings including no evidence for worsening of her chronic kidney disease, no substantial x-ray abnormalities, nor electrolyte abnormalities. She denies any ongoing complaints per We discussed findings at length,  importance of follow-up, and the patient is discharged in stable condition.  Final Clinical Impressions(s) / ED Diagnoses  Weakness   Carmin Muskrat, MD 08/05/18 1344

## 2018-08-05 NOTE — Telephone Encounter (Signed)
Left message on voicemail in reference to upcoming appointment scheduled for 08/10/18. Phone number given for a call back so details instructions can be given. Kirstie Peri, RN

## 2018-08-05 NOTE — Discharge Instructions (Addendum)
[facility]

## 2018-08-05 NOTE — ED Triage Notes (Addendum)
Patient states she had vomiting 2 days ago. Patient states she stopped the z pack she was given for URI and has not had any vomiting since. Patient also c/o fatigue and wanted to make sure it did not have anything to do with her renal failure.

## 2018-08-05 NOTE — Telephone Encounter (Signed)
New Message     Patient returning [facility] phone call to get instructions for upcoming Vascular test.

## 2018-08-10 ENCOUNTER — Ambulatory Visit (HOSPITAL_COMMUNITY): Payer: Medicaid Other | Attending: Cardiology

## 2018-08-10 ENCOUNTER — Ambulatory Visit (HOSPITAL_BASED_OUTPATIENT_CLINIC_OR_DEPARTMENT_OTHER): Payer: Medicaid Other

## 2018-08-10 DIAGNOSIS — R0602 Shortness of breath: Secondary | ICD-10-CM | POA: Diagnosis not present

## 2018-08-10 LAB — MYOCARDIAL PERFUSION IMAGING
CHL CUP RESTING HR STRESS: 97 {beats}/min
LV dias vol: 71 mL (ref 46–106)
LV sys vol: 28 mL
NUC STRESS TID: 0.98
Peak HR: 107 {beats}/min
SDS: 3
SRS: 2
SSS: 5

## 2018-08-10 MED ORDER — REGADENOSON 0.4 MG/5ML IV SOLN
0.4000 mg | Freq: Once | INTRAVENOUS | Status: AC
  Administered 2018-08-10: 0.4 mg via INTRAVENOUS

## 2018-08-10 MED ORDER — TECHNETIUM TC 99M TETROFOSMIN IV KIT
33.0000 | PACK | Freq: Once | INTRAVENOUS | Status: AC | PRN
  Administered 2018-08-10: 33 via INTRAVENOUS
  Filled 2018-08-10: qty 33

## 2018-08-10 MED ORDER — TECHNETIUM TC 99M TETROFOSMIN IV KIT
10.2000 | PACK | Freq: Once | INTRAVENOUS | Status: AC | PRN
  Administered 2018-08-10: 10.2 via INTRAVENOUS
  Filled 2018-08-10: qty 11

## 2018-08-11 ENCOUNTER — Encounter: Payer: Self-pay | Admitting: Nurse Practitioner

## 2018-08-11 NOTE — Telephone Encounter (Signed)
Please call pharmacy to see why can not get Xultophy.

## 2018-08-13 ENCOUNTER — Encounter: Payer: Self-pay | Admitting: Nurse Practitioner

## 2018-08-13 NOTE — Telephone Encounter (Signed)
Patient called requesting Xultophy, I called to [facility] Tracks for PA which was approved until 08/08/2019, authorization number is [id number].  Notified her pharmacy in which the computers are down but will submit once back up.  Patient is aware to contact the pharmacy later this afternoon.

## 2018-08-17 ENCOUNTER — Other Ambulatory Visit: Payer: Self-pay

## 2018-08-17 ENCOUNTER — Encounter: Payer: Self-pay | Admitting: Nurse Practitioner

## 2018-08-17 MED ORDER — GLUCOSE BLOOD VI STRP: ORAL_STRIP | 11 refills | Status: AC

## 2018-08-17 MED ORDER — ACCU-CHEK SOFTCLIX LANCETS MISC: 11 refills | Status: DC

## 2018-08-31 ENCOUNTER — Other Ambulatory Visit: Payer: Self-pay

## 2018-08-31 DIAGNOSIS — N184 Chronic kidney disease, stage 4 (severe): Secondary | ICD-10-CM

## 2018-09-08 ENCOUNTER — Encounter: Payer: Self-pay | Admitting: Nurse Practitioner

## 2018-09-08 ENCOUNTER — Other Ambulatory Visit: Payer: Self-pay

## 2018-09-08 MED ORDER — ACCU-CHEK FASTCLIX LANCETS MISC: 11 refills | Status: AC

## 2018-09-14 ENCOUNTER — Encounter: Payer: Self-pay | Admitting: Nurse Practitioner

## 2018-09-17 ENCOUNTER — Encounter: Payer: Self-pay | Admitting: Nurse Practitioner

## 2018-09-17 ENCOUNTER — Other Ambulatory Visit (HOSPITAL_COMMUNITY)
Admission: RE | Admit: 2018-09-17 | Discharge: 2018-09-17 | Disposition: A | Discharge status: Home / Self Care | Payer: Medicaid Other | Source: Ambulatory Visit | Attending: Nurse Practitioner | Admitting: Nurse Practitioner

## 2018-09-17 ENCOUNTER — Other Ambulatory Visit: Payer: Self-pay

## 2018-09-17 ENCOUNTER — Ambulatory Visit: Payer: Medicaid Other | Admitting: Nurse Practitioner

## 2018-09-17 VITALS — BP 146/84 | HR 102 | Temp 98.9°F | Ht 61.0 in | Wt 216.0 lb

## 2018-09-17 DIAGNOSIS — R350 Frequency of micturition: Secondary | ICD-10-CM | POA: Insufficient documentation

## 2018-09-17 DIAGNOSIS — I129 Hypertensive chronic kidney disease with stage 1 through stage 4 chronic kidney disease, or unspecified chronic kidney disease: Secondary | ICD-10-CM

## 2018-09-17 DIAGNOSIS — N184 Chronic kidney disease, stage 4 (severe): Secondary | ICD-10-CM

## 2018-09-17 DIAGNOSIS — R300 Dysuria: Secondary | ICD-10-CM | POA: Insufficient documentation

## 2018-09-17 MED ORDER — CIPROFLOXACIN HCL 500 MG PO TABS: 500.0000 mg | ORAL_TABLET | Freq: Two times a day (BID) | ORAL | 0 refills | Status: AC

## 2018-09-17 MED ORDER — FLUCONAZOLE 100 MG PO TABS: ORAL_TABLET | ORAL | 0 refills | Status: DC

## 2018-09-17 NOTE — Progress Notes (Signed)
Subjective:     Patient ID: Jane Santiago , female    DOB: [birth year] , 33 y.o.   MRN: [id number]   Chief Complaint  Patient presents with  . possible uti    burning with urination, pressnure , no fever     HPI  Urinary Frequency   This is a new problem. The current episode started in the past 7 days. The problem occurs every urination. The problem has been unchanged. The quality of the pain is described as burning. The pain is at a severity of 6/10. The pain is moderate. There has been no fever. She is not sexually active. There is no history of pyelonephritis. Associated symptoms include frequency. She has tried nothing for the symptoms.     Past Medical History:  Diagnosis Date  . Anemia   . CKD (chronic kidney disease) stage 4, GFR 15-29 ml/min (HCC) 08/02/2018  . Diabetes mellitus without complication ([place])   . Hypertension   . Shortness of breath 08/02/2018  . Tachycardia      Family History  Problem Relation Age of Onset  . Thyroid disease Mother   . Cervical cancer Mother   . Diabetes Father   . Hypertension Brother   . Hypertension Sister   . Valvular heart disease Child      Current Outpatient Medications:  .  ACCU-CHEK FASTCLIX LANCETS MISC, Use as directed to check blood sugars 1 time per day dx: e11.65, Disp: 102 each, Rfl: 11 .  acetaminophen (TYLENOL) 325 MG tablet, Take 650 mg by mouth every 6 (six) hours as needed for mild pain or headache., Disp: , Rfl:  .  albuterol (PROVENTIL HFA;VENTOLIN HFA) 108 (90 Base) MCG/ACT inhaler, Inhale 2 puffs into the lungs every 6 (six) hours as needed for wheezing or shortness of breath., Disp: 1 Inhaler, Rfl: 2 .  amLODipine (NORVASC) 5 MG tablet, Take 2 tablets (10 mg total) by mouth every evening., Disp: 60 tablet, Rfl: 0 .  carvedilol (COREG) 25 MG tablet, Take 25 mg by mouth 2 (two) times daily with a meal. , Disp: , Rfl:  .  furosemide (LASIX) 40 MG tablet, Take one tab by mouth with the 80mg  to total 120mg  BID, Disp:  60 tablet, Rfl: 2 .  furosemide (LASIX) 80 MG tablet, Take 80 mg by mouth 2 (two) times daily., Disp: , Rfl:  .  glucose blood (ACCU-CHEK GUIDE) test strip, Use as instructed to check blood sugars 1 time per day dx: e11.65, Disp: 100 each, Rfl: 11 .  hydrALAZINE (APRESOLINE) 25 MG tablet, Take 1 tablet (25 mg total) by mouth 3 (three) times daily., Disp: 120 tablet, Rfl: 2 .  Insulin Degludec-Liraglutide (XULTOPHY) 100-3.6 UNIT-MG/ML SOPN, Inject 20 Units into the skin daily. (Patient taking differently: Inject 32 Units into the skin daily. ), Disp: 15 mL, Rfl: 3 .  levonorgestrel (MIRENA) 20 MCG/24HR IUD, 1 each by Intrauterine route once., Disp: , Rfl:  .  Magnesium 200 MG TABS, Take 200 mg by mouth every morning., Disp: , Rfl:  .  Multiple Vitamins-Iron (MULTIVITAMINS WITH IRON) TABS tablet, Take 1 tablet by mouth daily. 1 tablet once a day, Disp: , Rfl:  .  omeprazole (PRILOSEC) 40 MG capsule, Take 40 mg by mouth daily. , Disp: , Rfl:    No Known Allergies   Review of Systems  Constitutional: Negative for fatigue.  Cardiovascular: Negative for chest pain, palpitations and leg swelling.  Endocrine: Negative for polydipsia, polyphagia and polyuria.  Genitourinary: Positive  for dysuria and frequency.  Neurological: Negative for dizziness and headaches.     Today's Vitals   09/17/18 1550  BP: (!) 146/84  Pulse: (!) 102  Temp: 98.9 F (37.2 C)  TempSrc: Oral  SpO2: 97%  Weight: 216 lb (98 kg)  Height: 5\' 1"  (1.549 m)   Body mass index is 40.81 kg/m.   Objective:  Physical Exam Vitals signs reviewed.  Constitutional:      Appearance: Normal appearance. She is obese. She is not ill-appearing.  Cardiovascular:     Rate and Rhythm: Normal rate and regular rhythm.     Pulses: Normal pulses.     Heart sounds: Normal heart sounds. No murmur.  Pulmonary:     Effort: Pulmonary effort is normal. No respiratory distress.     Breath sounds: Normal breath sounds. No wheezing or rales.   Abdominal:     General: Abdomen is flat. Bowel sounds are normal. There is no distension.     Palpations: Abdomen is soft.     Tenderness: There is abdominal tenderness. There is no right CVA tenderness, left CVA tenderness or guarding.  Skin:    Capillary Refill: Capillary refill takes less than 2 seconds.  Neurological:     General: No focal deficit present.     Mental Status: She is alert.  Psychiatric:        Mood and Affect: Mood normal.        Behavior: Behavior normal.        Thought Content: Thought content normal.        Judgment: Judgment normal.         Assessment And Plan:     1. Urinary frequency  Urinalysis shows moderate leuk and positive nitrate - Culture, Urine - Urine cytology ancillary only - ciprofloxacin (CIPRO) 500 MG tablet; Take 1 tablet (500 mg total) by mouth 2 (two) times daily for 3 days.  Dispense: 10 tablet; Refill: 0 - POCT Urinalysis Dipstick (81002)  2. Dysuria  UTI will send for STD screening too due to the leukocytes - Urine cytology ancillary only    Minette Brine, FNP

## 2018-09-19 LAB — URINE CULTURE

## 2018-09-20 ENCOUNTER — Other Ambulatory Visit: Payer: Self-pay | Admitting: Nurse Practitioner

## 2018-09-20 ENCOUNTER — Encounter: Payer: Self-pay | Admitting: Nurse Practitioner

## 2018-09-20 LAB — POCT URINALYSIS DIPSTICK
Bilirubin, UA: NEGATIVE
Glucose, UA: NEGATIVE
KETONES UA: NEGATIVE
Nitrite, UA: NEGATIVE
Protein, UA: POSITIVE — AB
Spec Grav, UA: 1.02 (ref 1.010–1.025)
Urobilinogen, UA: 0.2 E.U./dL
pH, UA: 7 (ref 5.0–8.0)

## 2018-09-21 ENCOUNTER — Ambulatory Visit (INDEPENDENT_AMBULATORY_CARE_PROVIDER_SITE_OTHER)
Admission: RE | Admit: 2018-09-21 | Discharge: 2018-09-21 | Disposition: A | Discharge status: Home / Self Care | Payer: Medicaid Other | Source: Ambulatory Visit | Attending: Vascular Surgery | Admitting: Vascular Surgery

## 2018-09-21 ENCOUNTER — Ambulatory Visit (HOSPITAL_COMMUNITY)
Admission: RE | Admit: 2018-09-21 | Discharge: 2018-09-21 | Disposition: A | Discharge status: Home / Self Care | Payer: Medicaid Other | Source: Ambulatory Visit | Attending: Vascular Surgery | Admitting: Vascular Surgery

## 2018-09-21 ENCOUNTER — Ambulatory Visit (INDEPENDENT_AMBULATORY_CARE_PROVIDER_SITE_OTHER): Payer: Medicaid Other | Admitting: Vascular Surgery

## 2018-09-21 ENCOUNTER — Encounter: Payer: Self-pay | Admitting: Vascular Surgery

## 2018-09-21 ENCOUNTER — Other Ambulatory Visit: Payer: Self-pay

## 2018-09-21 VITALS — BP 135/94 | HR 100 | Temp 98.0°F | Resp 18 | Ht 61.0 in | Wt 216.7 lb

## 2018-09-21 DIAGNOSIS — N184 Chronic kidney disease, stage 4 (severe): Secondary | ICD-10-CM | POA: Diagnosis present

## 2018-09-21 LAB — URINE CYTOLOGY ANCILLARY ONLY
Candida vaginitis: NEGATIVE
Chlamydia: NEGATIVE
NEISSERIA GONORRHEA: NEGATIVE
Trichomonas: NEGATIVE

## 2018-09-21 NOTE — Progress Notes (Signed)
[facility] [place]  Patient name: Jane Santiago MRN: [id number] DOB: 05-23-1986 Sex: female  REASON FOR CONSULT: Evaluate for hemodialysis access  HPI: Jane Santiago is a 33 y.o. female, who is here today for discussion of hemodialysis access.  She has progressive renal insufficiency and is not yet on dialysis.  She does not have a history of pacemaker.  She is right-handed.  She is not had central venous catheters in the past.  Her renal insufficiency is felt to be based on diabetes.  Past Medical History:  Diagnosis Date  . Anemia   . CKD (chronic kidney disease) stage 4, GFR 15-29 ml/min (HCC) 08/02/2018  . Diabetes mellitus without complication ([place])   . Hypertension   . Shortness of breath 08/02/2018  . Tachycardia     Family History  Problem Relation Age of Onset  . Thyroid disease Mother   . Cervical cancer Mother   . Diabetes Father   . Hypertension Brother   . Hypertension Sister   . Valvular heart disease Child     SOCIAL HISTORY: Social History   Socioeconomic History  . Marital status: Single    Spouse name: Not on file  . Number of children: Not on file  . Years of education: Not on file  . Highest education level: Not on file  Occupational History  . Not on file  Social Needs  . Financial resource strain: Not on file  . Food insecurity:    Worry: Not on file    Inability: Not on file  . Transportation needs:    Medical: Not on file    Non-medical: Not on file  Tobacco Use  . Smoking status: Never Smoker  . Smokeless tobacco: Never Used  Substance and Sexual Activity  . Alcohol use: No  . Drug use: No  . Sexual activity: Not on file  Lifestyle  . Physical activity:    Days per week: Not on file    Minutes per session: Not on file  . Stress: Not on file  Relationships  . Social connections:    Talks on phone: Not on file    Gets together: Not on file    Attends religious service: Not on  file    Active member of club or organization: Not on file    Attends meetings of clubs or organizations: Not on file    Relationship status: Not on file  . Intimate partner violence:    Fear of current or ex partner: Not on file    Emotionally abused: Not on file    Physically abused: Not on file    Forced sexual activity: Not on file  Other Topics Concern  . Not on file  Social History Narrative  . Not on file    No Known Allergies  Current Outpatient Medications  Medication Sig Dispense Refill  . ACCU-CHEK FASTCLIX LANCETS MISC Use as directed to check blood sugars 1 time per day dx: e11.65 102 each 11  . acetaminophen (TYLENOL) 325 MG tablet Take 650 mg by mouth every 6 (six) hours as needed for mild pain or headache.    . albuterol (PROVENTIL HFA;VENTOLIN HFA) 108 (90 Base) MCG/ACT inhaler Inhale 2 puffs into the lungs every 6 (six) hours as needed for wheezing or shortness of breath. 1 Inhaler 2  . amLODipine (NORVASC) 5 MG tablet Take 2 tablets (10 mg total) by mouth every evening. 60 tablet 0  . BD PEN NEEDLE NANO  U/F 32G X 4 MM MISC USE AS DIRECTED 300 each 1  . carvedilol (COREG) 25 MG tablet Take 25 mg by mouth 2 (two) times daily with a meal.     . furosemide (LASIX) 40 MG tablet Take one tab by mouth with the 80mg  to total 120mg  BID 60 tablet 2  . furosemide (LASIX) 80 MG tablet Take 80 mg by mouth 2 (two) times daily.    Marland Kitchen glucose blood (ACCU-CHEK GUIDE) test strip Use as instructed to check blood sugars 1 time per day dx: e11.65 100 each 11  . hydrALAZINE (APRESOLINE) 25 MG tablet Take 1 tablet (25 mg total) by mouth 3 (three) times daily. 120 tablet 2  . Insulin Degludec-Liraglutide (XULTOPHY) 100-3.6 UNIT-MG/ML SOPN Inject 20 Units into the skin daily. (Patient taking differently: Inject 32 Units into the skin daily. ) 15 mL 3  . Iron-Vitamin C (IRON 100/C PO) Take by mouth.    . levonorgestrel (MIRENA) 20 MCG/24HR IUD 1 each by Intrauterine route once.    . Magnesium  200 MG TABS Take 200 mg by mouth every morning.    . Multiple Vitamins-Iron (MULTIVITAMINS WITH IRON) TABS tablet Take 1 tablet by mouth daily. 1 tablet once a day    . omeprazole (PRILOSEC) 40 MG capsule Take 40 mg by mouth daily.      No current facility-administered medications for this visit.     REVIEW OF SYSTEMS:  [X]  denotes positive finding, [ ]  denotes negative finding Cardiac  Comments:  Chest pain or chest pressure:    Shortness of breath upon exertion:    Short of breath when lying flat:    Irregular heart rhythm:        Vascular    Pain in calf, thigh, or hip brought on by ambulation:    Pain in feet at night that wakes you up from your sleep:  x   Blood clot in your veins:    Leg swelling:  x       Pulmonary    Oxygen at home:    Productive cough:     Wheezing:         Neurologic    Sudden weakness in arms or legs:     Sudden numbness in arms or legs:     Sudden onset of difficulty speaking or slurred speech:    Temporary loss of vision in one eye:     Problems with dizziness:         Gastrointestinal    Blood in stool:     Vomited blood:         Genitourinary    Burning when urinating:     Blood in urine:        Psychiatric    Major depression:         Hematologic    Bleeding problems:    Problems with blood clotting too easily:        Skin    Rashes or ulcers:        Constitutional    Fever or chills:      PHYSICAL EXAM: Vitals:   09/21/18 1420  BP: (!) 135/94  Pulse: 100  Resp: 18  Temp: 98 F (36.7 C)  SpO2: 100%  Weight: 216 lb 11.2 oz (98.3 kg)  Height: 5\' 1"  (1.549 m)    GENERAL: The patient is a well-nourished female, in no acute distress. The vital signs are documented above. CARDIOVASCULAR: 2+ radial pulses bilaterally.  Small surface  veins bilaterally PULMONARY: There is good air exchange  ABDOMEN: Soft and non-tender  MUSCULOSKELETAL: There are no major deformities or cyanosis. NEUROLOGIC: No focal weakness or  paresthesias are detected. SKIN: There are no ulcers or rashes noted. PSYCHIATRIC: The patient has a normal affect.  DATA:  Upper extremity arterial studies revealed normal arterial flow to the level of the wrist bilaterally.  Upper extremity venous studies reveal very small cephalic and basilic veins bilaterally  MEDICAL ISSUES: I imaged her veins with SonoSite ultrasound as well.  This does show moderate antecubital veins bilaterally but veins above this are very small less than 2 mm.  I feel that she has very little chance of success with fistula creation.  I feel she would be best served with a left arm AV Gore-Tex graft.  I would defer this until she is approaching need for dialysis.  Explained that it would take approximately 1 month from the procedure until she would be able to use her access.  She will continue her follow-up with nephrology and we are available for left upper arm graft placement when timing is appropriate   Rosetta Posner, MD [facility] [phone] Pager [phone]

## 2018-09-22 ENCOUNTER — Ambulatory Visit: Payer: Medicaid Other | Admitting: Nurse Practitioner

## 2018-09-22 ENCOUNTER — Encounter: Payer: Self-pay | Admitting: Nurse Practitioner

## 2018-09-22 ENCOUNTER — Other Ambulatory Visit (HOSPITAL_COMMUNITY)
Admission: RE | Admit: 2018-09-22 | Discharge: 2018-09-22 | Disposition: A | Discharge status: Home / Self Care | Payer: Medicaid Other | Source: Ambulatory Visit | Attending: Nurse Practitioner | Admitting: Nurse Practitioner

## 2018-09-22 VITALS — BP 132/86 | HR 90 | Temp 98.8°F | Ht 60.0 in | Wt 218.6 lb

## 2018-09-22 DIAGNOSIS — Z0000 Encounter for general adult medical examination without abnormal findings: Secondary | ICD-10-CM

## 2018-09-22 DIAGNOSIS — Z794 Long term (current) use of insulin: Secondary | ICD-10-CM

## 2018-09-22 DIAGNOSIS — E1165 Type 2 diabetes mellitus with hyperglycemia: Secondary | ICD-10-CM

## 2018-09-22 DIAGNOSIS — Z139 Encounter for screening, unspecified: Secondary | ICD-10-CM

## 2018-09-22 DIAGNOSIS — I129 Hypertensive chronic kidney disease with stage 1 through stage 4 chronic kidney disease, or unspecified chronic kidney disease: Secondary | ICD-10-CM

## 2018-09-22 DIAGNOSIS — N184 Chronic kidney disease, stage 4 (severe): Secondary | ICD-10-CM

## 2018-09-22 DIAGNOSIS — I10 Essential (primary) hypertension: Secondary | ICD-10-CM

## 2018-09-22 DIAGNOSIS — Z124 Encounter for screening for malignant neoplasm of cervix: Secondary | ICD-10-CM

## 2018-09-22 LAB — POCT URINALYSIS DIPSTICK
BILIRUBIN UA: NEGATIVE
Glucose, UA: POSITIVE — AB
Ketones, UA: NEGATIVE
Leukocytes, UA: NEGATIVE
Nitrite, UA: NEGATIVE
Protein, UA: POSITIVE — AB
Spec Grav, UA: 1.02 (ref 1.010–1.025)
Urobilinogen, UA: 0.2 E.U./dL
pH, UA: 7 (ref 5.0–8.0)

## 2018-09-22 LAB — POCT UA - MICROALBUMIN
Albumin/Creatinine Ratio, Urine, POC: >300
Creatinine, POC: 200 mg/dL
Microalbumin Ur, POC: 150 mg/L

## 2018-09-22 MED ORDER — INSULIN DEGLUDEC-LIRAGLUTIDE 100-3.6 UNIT-MG/ML ~~LOC~~ SOPN: 32.0000 [IU] | PEN_INJECTOR | Freq: Every day | SUBCUTANEOUS | 6 refills | Status: AC

## 2018-09-22 NOTE — Addendum Note (Signed)
Addended by: Roxine Caddy on: 09/22/2018 04:27 PM   Modules accepted: Orders

## 2018-09-22 NOTE — Progress Notes (Addendum)
Subjective:     Patient ID: Jane Santiago , female    DOB: Oct 15, 1985 , 33 y.o.   MRN: [id number]   Chief Complaint  Patient presents with  . Annual Exam    HPI  Here for HM  She is now on the kidney donor list 2 weeks ago.  Dr Hollie Salk wants to be proactive for the kidney donor list.    She has been going to [facility] and needs to have up to date testing and PAP. She has an appt with the transplant team on Tuesday  Diabetes  She presents for her follow-up diabetic visit. She has type 2 diabetes mellitus. Her disease course has been improving. Pertinent negatives for hypoglycemia include no confusion, dizziness, headaches or nervousness/anxiousness. Pertinent negatives for diabetes include no blurred vision, no chest pain, no fatigue, no polydipsia, no polyphagia and no polyuria. There are no hypoglycemic complications. There are no diabetic complications. There are no known risk factors for coronary artery disease. She is compliant with treatment all of the time. Her weight is stable. Diabetic current diet: fair diet avoiding sugars and salt. (This morning was 130.  ) An ACE inhibitor/angiotensin II receptor blocker is being taken. She does not see a podiatrist.Eye exam is current (Monday has an appt).  Hypertension  This is a chronic problem. The current episode started more than 1 year ago. The problem is uncontrolled. Associated symptoms include peripheral edema. Pertinent negatives include no anxiety, blurred vision, chest pain, headaches or palpitations. There are no associated agents to hypertension. Risk factors for coronary artery disease include obesity, sedentary lifestyle and diabetes mellitus. Past treatments include calcium channel blockers and diuretics. Hypertensive end-organ damage includes kidney disease. There is no history of angina. Identifiable causes of hypertension include chronic renal disease. There is no history of coarctation of the aorta.    The patient states  she uses IUD for birth control. Last LMP was No LMP recorded (lmp unknown). (Menstrual status: IUD).. Negative for Dysmenorrhea and Negative for Menorrhagia Mammogram last done N/A Negative for: breast discharge, breast lump(s), breast pain and breast self exam.  Pertinent negatives include abnormal bleeding (hematology), anxiety, decreased libido, depression, difficulty falling sleep, dyspareunia, history of infertility, nocturia, sexual dysfunction, sleep disturbances, urinary incontinence, urinary urgency, vaginal discharge and vaginal itching. Diet regular, no salt avoiding sugars.  The patient states her exercise level is  10 minutes a day.        The patient's tobacco use is:  Social History   Tobacco Use  Smoking Status Never Smoker  Smokeless Tobacco Never Used   She has been exposed to passive smoke. The patient's alcohol use is:  Social History   Substance and Sexual Activity  Alcohol Use No   Additional information: Last pap 2019 at [facility], next one scheduled for checking the IUD strings today.    Past Medical History:  Diagnosis Date  . Anemia   . CKD (chronic kidney disease) stage 4, GFR 15-29 ml/min (HCC) 08/02/2018  . Diabetes mellitus without complication ([place])   . Hypertension   . Shortness of breath 08/02/2018  . Tachycardia      Family History  Problem Relation Age of Onset  . Thyroid disease Mother   . Cervical cancer Mother   . Diabetes Father   . Hypertension Brother   . Hypertension Sister   . Valvular heart disease Child      Current Outpatient Medications:  .  ACCU-CHEK FASTCLIX LANCETS MISC, Use as  directed to check blood sugars 1 time per day dx: e11.65, Disp: 102 each, Rfl: 11 .  acetaminophen (TYLENOL) 325 MG tablet, Take 650 mg by mouth every 6 (six) hours as needed for mild pain or headache., Disp: , Rfl:  .  albuterol (PROVENTIL HFA;VENTOLIN HFA) 108 (90 Base) MCG/ACT inhaler, Inhale 2 puffs into the lungs every 6 (six) hours as needed  for wheezing or shortness of breath., Disp: 1 Inhaler, Rfl: 2 .  amLODipine (NORVASC) 5 MG tablet, Take 2 tablets (10 mg total) by mouth every evening., Disp: 60 tablet, Rfl: 0 .  BD PEN NEEDLE NANO U/F 32G X 4 MM MISC, USE AS DIRECTED, Disp: 300 each, Rfl: 1 .  carvedilol (COREG) 25 MG tablet, Take 25 mg by mouth 2 (two) times daily with a meal. , Disp: , Rfl:  .  furosemide (LASIX) 40 MG tablet, Take one tab by mouth with the 80mg  to total 120mg  BID, Disp: 60 tablet, Rfl: 2 .  furosemide (LASIX) 80 MG tablet, Take 80 mg by mouth 2 (two) times daily., Disp: , Rfl:  .  glucose blood (ACCU-CHEK GUIDE) test strip, Use as instructed to check blood sugars 1 time per day dx: e11.65, Disp: 100 each, Rfl: 11 .  hydrALAZINE (APRESOLINE) 25 MG tablet, Take 1 tablet (25 mg total) by mouth 3 (three) times daily., Disp: 120 tablet, Rfl: 2 .  Insulin Degludec-Liraglutide (XULTOPHY) 100-3.6 UNIT-MG/ML SOPN, Inject 20 Units into the skin daily. (Patient taking differently: Inject 32 Units into the skin daily. ), Disp: 15 mL, Rfl: 3 .  Iron-Vitamin C (IRON 100/C PO), Take by mouth., Disp: , Rfl:  .  levonorgestrel (MIRENA) 20 MCG/24HR IUD, 1 each by Intrauterine route once., Disp: , Rfl:  .  Magnesium 200 MG TABS, Take 200 mg by mouth every morning., Disp: , Rfl:  .  Multiple Vitamins-Iron (MULTIVITAMINS WITH IRON) TABS tablet, Take 1 tablet by mouth daily. 1 tablet once a day, Disp: , Rfl:  .  omeprazole (PRILOSEC) 40 MG capsule, Take 40 mg by mouth daily. , Disp: , Rfl:    No Known Allergies   Review of Systems  Constitutional: Negative.  Negative for fatigue.  HENT: Negative.   Eyes: Negative.  Negative for blurred vision.  Respiratory: Negative.   Cardiovascular: Negative.  Negative for chest pain and palpitations.  Gastrointestinal: Negative.   Endocrine: Negative.  Negative for polydipsia, polyphagia and polyuria.  Genitourinary: Negative.   Musculoskeletal: Negative.   Skin: Negative.    Allergic/Immunologic: Negative.   Neurological: Negative.  Negative for dizziness and headaches.  Hematological: Negative.   Psychiatric/Behavioral: Negative.  Negative for confusion. The patient is not nervous/anxious.      Today's Vitals   09/22/18 0949  BP: 132/86  Pulse: 90  Temp: 98.8 F (37.1 C)  TempSrc: Oral  Weight: 218 lb 9.6 oz (99.2 kg)  Height: 5' (1.524 m)   Body mass index is 42.69 kg/m.   Objective:  Physical Exam Vitals signs reviewed.  Constitutional:      General: She is not in acute distress.    Appearance: Normal appearance. She is well-developed. She is obese.  HENT:     Head: Normocephalic and atraumatic.     Right Ear: Hearing, tympanic membrane, ear canal and external ear normal.     Left Ear: Hearing, tympanic membrane, ear canal and external ear normal.     Nose: Nose normal.     Mouth/Throat:     Mouth: Mucous membranes are  moist.  Eyes:     General: Lids are normal.     Conjunctiva/sclera: Conjunctivae normal.     Pupils: Pupils are equal, round, and reactive to light.     Funduscopic exam:    Right eye: No papilledema.        Left eye: No papilledema.  Neck:     Musculoskeletal: Full passive range of motion without pain, normal range of motion and neck supple.     Thyroid: No thyroid mass.     Vascular: No carotid bruit.  Cardiovascular:     Rate and Rhythm: Normal rate and regular rhythm.     Pulses: Normal pulses.     Heart sounds: Normal heart sounds. No murmur.  Pulmonary:     Effort: Pulmonary effort is normal.     Breath sounds: Normal breath sounds.  Chest:     Chest wall: No mass.     Breasts:        Right: Normal. No mass, nipple discharge or tenderness.        Left: Normal. No mass, nipple discharge or tenderness.  Abdominal:     General: Abdomen is flat. Bowel sounds are normal.     Palpations: Abdomen is soft.  Genitourinary:    Vagina: Vaginal discharge present.     Cervix: Normal.     Uterus: Normal.       Adnexa: Right adnexa normal and left adnexa normal.       Right: No mass or tenderness.         Left: No mass or tenderness.       Comments: White discharge present to vaginal vault no odor Musculoskeletal: Normal range of motion.        General: No swelling.     Right lower leg: No edema.     Left lower leg: No edema.  Skin:    General: Skin is warm and dry.     Capillary Refill: Capillary refill takes less than 2 seconds.  Neurological:     General: No focal deficit present.     Mental Status: She is alert and oriented to person, place, and time.     Cranial Nerves: No cranial nerve deficit.     Sensory: No sensory deficit.  Psychiatric:        Mood and Affect: Mood normal.        Behavior: Behavior normal.        Thought Content: Thought content normal.        Judgment: Judgment normal.         Assessment And Plan:   1. Health maintenance examination . Behavior modifications discussed and diet history reviewed.   . Pt will continue to exercise regularly and modify diet with low GI, plant based foods and decrease intake of processed foods.  . Recommend intake of daily multivitamin, Vitamin D, and calcium.  . Recommend mammogram and colonoscopy for preventive screenings, as well as recommend immunizations that include influenza, TDAP  2. Encounter for screening  She is being seen at [facility] kidney transplant next appt in one week. - HIV antibody (with reflex)  3. Type 2 diabetes mellitus with hyperglycemia, with long-term current use of insulin (HCC)  Chronic, poorly controlled however she reports her blood sugars are improving  Continue with current medications  Encouraged to limit intake of sugary foods and drinks  Encouraged to increase physical activity to 150 minutes per week - Lipid panel - Hemoglobin A1c - TSH -  Insulin Degludec-Liraglutide (XULTOPHY) 100-3.6 UNIT-MG/ML SOPN; Inject 32 Units into the skin daily.  Dispense: 5 pen; Refill:  6  4. Essential hypertension . B/P is fair control.  . CMP ordered to check renal function.  . The importance of regular exercise and dietary modification was stressed to the patient.  . Stressed importance of losing ten percent of her body weight to help with B/P control.  . The weight loss would help with decreasing cardiac and cancer risk as well.  . EKG done reveals sinus tachycardia at 103, she is being followed by Cardiology as well next appt in April - CMP14 + Anion Gap - Hemoglobin A1c  5. CKD (chronic kidney disease) stage 4, GFR 15-29 ml/min (HCC)  Being followed by Dr. Hollie Salk and she has been placed on the transplant list 2 weeks ago - CMP14 + Anion Gap - Hemoglobin A1c  6. Encounter for Papanicolaou smear of cervix  Strings are present to cervix   She has thin white discharge to the vaginal vault   Minette Brine, FNP

## 2018-09-23 LAB — CMP14 + ANION GAP
ALT: 19 IU/L (ref 0–32)
AST: 18 IU/L (ref 0–40)
Albumin/Globulin Ratio: 1.1 — ABNORMAL LOW (ref 1.2–2.2)
Albumin: 3.7 g/dL — ABNORMAL LOW (ref 3.8–4.8)
Alkaline Phosphatase: 90 IU/L (ref 39–117)
Anion Gap: 14 mmol/L (ref 10.0–18.0)
BUN/Creatinine Ratio: 11 (ref 9–23)
BUN: 48 mg/dL — ABNORMAL HIGH (ref 6–20)
Bilirubin Total: <0.2 mg/dL (ref 0.0–1.2)
CO2: 19 mmol/L — ABNORMAL LOW (ref 20–29)
CREATININE: 4.39 mg/dL — AB (ref 0.57–1.00)
Calcium: 8.8 mg/dL (ref 8.7–10.2)
Chloride: 104 mmol/L (ref 96–106)
GFR calc Af Amer: 14 mL/min/{1.73_m2} — ABNORMAL LOW (ref >59)
GFR calc non Af Amer: 12 mL/min/{1.73_m2} — ABNORMAL LOW (ref >59)
GLOBULIN, TOTAL: 3.3 g/dL (ref 1.5–4.5)
Glucose: 124 mg/dL — ABNORMAL HIGH (ref 65–99)
Potassium: 5.3 mmol/L — ABNORMAL HIGH (ref 3.5–5.2)
SODIUM: 137 mmol/L (ref 134–144)
Total Protein: 7 g/dL (ref 6.0–8.5)

## 2018-09-23 LAB — HIV ANTIBODY (ROUTINE TESTING W REFLEX): HIV Screen 4th Generation wRfx: NONREACTIVE

## 2018-09-23 LAB — HEMOGLOBIN A1C
ESTIMATED AVERAGE GLUCOSE: 180 mg/dL
Hgb A1c MFr Bld: 7.9 % — ABNORMAL HIGH (ref 4.8–5.6)

## 2018-09-23 LAB — LIPID PANEL
CHOL/HDL RATIO: 3.8 ratio (ref 0.0–4.4)
Cholesterol, Total: 150 mg/dL (ref 100–199)
HDL: 39 mg/dL — ABNORMAL LOW (ref >39)
LDL Calculated: 92 mg/dL (ref 0–99)
TRIGLYCERIDES: 93 mg/dL (ref 0–149)
VLDL Cholesterol Cal: 19 mg/dL (ref 5–40)

## 2018-09-23 LAB — TSH: TSH: 1.86 u[IU]/mL (ref 0.450–4.500)

## 2018-09-23 NOTE — Progress Notes (Signed)
Please fax a copy of labs to Dr. Hollie Salk at [facility]. Thanks

## 2018-09-24 LAB — CYTOLOGY - PAP: Diagnosis: NEGATIVE

## 2018-09-30 ENCOUNTER — Encounter: Payer: Self-pay | Admitting: Nurse Practitioner

## 2018-10-08 ENCOUNTER — Encounter: Payer: Self-pay | Admitting: Nurse Practitioner

## 2018-10-26 ENCOUNTER — Telehealth: Payer: Self-pay

## 2018-10-26 NOTE — Telephone Encounter (Signed)
Virtual Visit Pre-Appointment Phone Call  Steps For Call: [place] PHONE   1. Confirm consent - "In the setting of the current Covid19 crisis, you are scheduled for a (phone or video) visit with your provider on (date) at (time).  Just as we do with many in-office visits, in order for you to participate in this visit, we must obtain consent.  If you'd like, I can send this to your mychart (if signed up) or email for you to review.  Otherwise, I can obtain your verbal consent now.  All virtual visits are billed to your insurance company just like a normal visit would be.  By agreeing to a virtual visit, we'd like you to understand that the technology does not allow for your provider to perform an examination, and thus may limit your provider's ability to fully assess your condition. If your provider identifies any concerns that need to be evaluated in person, we will make arrangements to do so.  Finally, though the technology is pretty good, we cannot assure that it will always work on either your or our end, and in the setting of a video visit, we may have to convert it to a phone-only visit.  In either situation, we cannot ensure that we have a secure connection.  Are you willing to proceed?" STAFF: Did the patient verbally acknowledge consent to telehealth visit? Document YES/NO here:   2. Confirm the BEST phone number to call the day of the visit by including in appointment notes  3. Give patient instructions for MyChart download to smartphone OR Doximity/[email or web address] as below if video visit (depending on what platform provider is using)  4. Confirm that appointment type is correct in Epic appointment notes (VIDEO vs PHONE)  5. Advise patient to be prepared with their blood pressure, heart rate, weight, any heart rhythm information, their current medicines, and a piece of paper and pen handy for any instructions they may receive the day of their visit  6. Inform patient they will receive a  phone call 15 minutes prior to their appointment time (may be from unknown caller ID) so they should be prepared to answer    TELEPHONE CALL NOTE  Jane Santiago has been deemed a candidate for a follow-up tele-health visit to limit community exposure during the Covid-19 pandemic. I spoke with the patient via phone to ensure availability of phone/video source, confirm preferred email & phone number, and discuss instructions and expectations.  I reminded Jane Santiago to be prepared with any vital sign and/or heart rhythm information that could potentially be obtained via home monitoring, at the time of her visit. I reminded Jane Santiago to expect a phone call prior to her visit.  [place] 10/26/2018 9:09 AM   INSTRUCTIONS FOR DOWNLOADING THE MYCHART APP TO SMARTPHONE  - The patient must first make sure to have activated MyChart and know their login information - If Apple, go to [employer] and type in MyChart in the search bar and download the app. If Android, ask patient to go to [employer] and type in [place] in the search bar and download the app. The app is free but as with any other app downloads, their phone may require them to verify saved payment information or Apple/Android password.  - The patient will need to then log into the app with their MyChart username and password, and select  as their healthcare provider to link the account. When  it is time for your visit, go to the MyChart app, find appointments, and click Begin Video Visit. Be sure to Select Allow for your device to access the Microphone and Camera for your visit. You will then be connected, and your provider will be with you shortly.  **If they have any issues connecting, or need assistance please contact [facility] [phone])  **If using a computer, in order to ensure the best quality for their visit they will need to use either of the following Internet Browsers: [employer], or Google Chrome  IF USING DOXIMITY or [email or web address].ME - The patient will receive a link just prior to their visit by text.     FULL LENGTH CONSENT FOR TELE-HEALTH VISIT   I hereby voluntarily request, consent and authorize [facility] and its employed or contracted physicians, physician assistants, nurse practitioners or other licensed health care professionals (the Practitioner), to provide me with telemedicine health care services (the "Services") as deemed necessary by the treating Practitioner. I acknowledge and consent to receive the Services by the Practitioner via telemedicine. I understand that the telemedicine visit will involve communicating with the Practitioner through live audiovisual communication technology and the disclosure of certain medical information by electronic transmission. I acknowledge that I have been given the opportunity to request an in-person assessment or other available alternative prior to the telemedicine visit and am voluntarily participating in the telemedicine visit.  I understand that I have the right to withhold or withdraw my consent to the use of telemedicine in the course of my care at any time, without affecting my right to future care or treatment, and that the Practitioner or I may terminate the telemedicine visit at any time. I understand that I have the right to inspect all information obtained and/or recorded in the course of the telemedicine visit and may receive copies of available information for a reasonable fee.  I understand that some of the potential risks of receiving the Services via telemedicine include:  Marland Kitchen Delay or interruption in medical evaluation due to technological equipment failure or disruption; . Information transmitted may not be sufficient (e.g. poor resolution of images) to allow for appropriate medical decision making by the Practitioner; and/or  . In rare instances, security protocols could fail, causing a breach of personal  health information.  Furthermore, I acknowledge that it is my responsibility to provide information about my medical history, conditions and care that is complete and accurate to the best of my ability. I acknowledge that Practitioner's advice, recommendations, and/or decision may be based on factors not within their control, such as incomplete or inaccurate data provided by me or distortions of diagnostic images or specimens that may result from electronic transmissions. I understand that the practice of medicine is not an exact science and that Practitioner makes no warranties or guarantees regarding treatment outcomes. I acknowledge that I will receive a copy of this consent concurrently upon execution via email to the email address I last provided but may also request a printed copy by calling the office of [facility].    I understand that my insurance will be billed for this visit.   I have read or had this consent read to me. . I understand the contents of this consent, which adequately explains the benefits and risks of the Services being provided via telemedicine.  . I have been provided ample opportunity to ask questions regarding this consent and the Services and have had my questions answered to my  satisfaction. . I give my informed consent for the services to be provided through the use of telemedicine in my medical care  By participating in this telemedicine visit I agree to the above.

## 2018-11-03 ENCOUNTER — Telehealth: Payer: Self-pay | Admitting: Cardiovascular Disease

## 2018-11-03 NOTE — Telephone Encounter (Signed)
Patient stated that video or tele visit will be ok/smartphone/ consent/ my chart/ pre reg completed

## 2018-11-04 ENCOUNTER — Telehealth (INDEPENDENT_AMBULATORY_CARE_PROVIDER_SITE_OTHER): Payer: Medicaid Other | Admitting: Cardiovascular Disease

## 2018-11-04 ENCOUNTER — Encounter: Payer: Self-pay | Admitting: Cardiovascular Disease

## 2018-11-04 DIAGNOSIS — R600 Localized edema: Secondary | ICD-10-CM

## 2018-11-04 DIAGNOSIS — I129 Hypertensive chronic kidney disease with stage 1 through stage 4 chronic kidney disease, or unspecified chronic kidney disease: Secondary | ICD-10-CM | POA: Diagnosis not present

## 2018-11-04 DIAGNOSIS — R0602 Shortness of breath: Secondary | ICD-10-CM | POA: Diagnosis not present

## 2018-11-04 DIAGNOSIS — N184 Chronic kidney disease, stage 4 (severe): Secondary | ICD-10-CM | POA: Diagnosis not present

## 2018-11-04 DIAGNOSIS — I10 Essential (primary) hypertension: Secondary | ICD-10-CM

## 2018-11-04 NOTE — Progress Notes (Signed)
Virtual Visit via Video Note   This visit type was conducted due to national recommendations for restrictions regarding the COVID-19 Pandemic (e.g. social distancing) in an effort to limit this patient's exposure and mitigate transmission in our community.  Due to her co-morbid illnesses, this patient is at least at moderate risk for complications without adequate follow up.  This format is felt to be most appropriate for this patient at this time.  All issues noted in this document were discussed and addressed.  A limited physical exam was performed with this format.  Please refer to the patient's chart for her consent to telehealth for [facility]   Evaluation Performed:  Follow-up visit  Date:  11/04/2018   ID:  Jane Santiago, DOB 1985-07-08, MRN [id number]  Patient Location: Home Provider Location: Home  PCP:  Minette Brine, FNP  Cardiologist:  Skeet Latch, MD  Electrophysiologist:  None   Chief Complaint:  Follow up  History of Present Illness:    Jane Santiago is a 33 y.o. female with diabetes, hypertension, and CKD IV here for follow up.  She was initially seen 03/2017 for an evaluation of lower extremity edema.  At the time she had no shortness of breath, orthopnea, or PND. She initially followed up with her primary care and was started on hydrochlorothiazide, which has helped somewhat. 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 She does limit the salt in her diet. [name] was initially diagnosed with hypertension in 2012 at the time of her pregnancy. She has been diabetic since age 19.  She had an echo 03/2017 that revealed LVEF 55-60% with normal diastolic function.  Her IVC was not dilated.  Since she was last seen she was diagnosed with renal failure.  She was recently hospitalized due to hyperglycemia.  Her diabetes has been poorly-controlled lately and her renal failure is thought to be due to her diabetes.  She is followed by Dr. Hollie Salk.    At her last appointment  [name] complained of lower extremity edema and shortness of breath.  She was referred for an echocardiogram 08/2018 that revealed normal systolic and diastolic function.  There is no evidence of volume overload.  She also had a [place] Myoview given her history of poorly controlled diabetes and chronic kidney disease.  This was negative for ischemia.  Since her last appointment she has been feeling well.  She saw Dr. Hollie Salk 2 weeks ago and furosemide was discontinued.  She was started on torsemide.  She has noted a significant improvement in her lower extremity edema.  She has no orthopnea or PND.  Blood pressure has been well-controlled.  She started in a weight loss program 3 weeks ago that includes a 1000-calorie restriction.  She also has started exercising by walking and doing YouTube videos.  She has no exertional chest pain or shortness of breath.  She is currently on the kidney transplant list.  The patient does not have symptoms concerning for COVID-19 infection (fever, chills, cough, or new shortness of breath).    Past Medical History:  Diagnosis Date  . Anemia   . CKD (chronic kidney disease) stage 4, GFR 15-29 ml/min (HCC) 08/02/2018  . Diabetes mellitus without complication ([place])   . Hypertension   . Shortness of breath 08/02/2018  . Tachycardia    Past Surgical History:  Procedure Laterality Date  . CESAREAN SECTION     x2     Current Meds  Medication  Sig  . ACCU-CHEK FASTCLIX LANCETS MISC Use as directed to check blood sugars 1 time per day dx: e11.65  . acetaminophen (TYLENOL) 325 MG tablet Take 650 mg by mouth every 6 (six) hours as needed for mild pain or headache.  . albuterol (PROVENTIL HFA;VENTOLIN HFA) 108 (90 Base) MCG/ACT inhaler Inhale 2 puffs into the lungs every 6 (six) hours as needed for wheezing or shortness of breath.  Marland Kitchen amLODipine (NORVASC) 5 MG tablet Take 2 tablets (10 mg total) by mouth every evening.  . BD PEN NEEDLE NANO U/F 32G X 4 MM MISC USE AS  DIRECTED  . calcitRIOL (ROCALTROL) 0.25 MCG capsule TK 1 C PO D  . carvedilol (COREG) 25 MG tablet Take 25 mg by mouth 2 (two) times daily with a meal.   . glucose blood (ACCU-CHEK GUIDE) test strip Use as instructed to check blood sugars 1 time per day dx: e11.65  . hydrALAZINE (APRESOLINE) 25 MG tablet Take 1 tablet (25 mg total) by mouth 3 (three) times daily.  . Insulin Degludec-Liraglutide (XULTOPHY) 100-3.6 UNIT-MG/ML SOPN Inject 32 Units into the skin daily.  Marland Kitchen levonorgestrel (MIRENA) 20 MCG/24HR IUD 1 each by Intrauterine route once.  . Magnesium 200 MG TABS Take 200 mg by mouth every morning.  Marland Kitchen omeprazole (PRILOSEC) 40 MG capsule Take 40 mg by mouth daily.   Marland Kitchen torsemide (DEMADEX) 20 MG tablet TK 3 TS PO BID     Allergies:   Patient has no known allergies.   Social History   Tobacco Use  . Smoking status: Never Smoker  . Smokeless tobacco: Never Used  Substance Use Topics  . Alcohol use: No  . Drug use: No     Family Hx: The patient's family history includes Cervical cancer in her mother; Diabetes in her father; Hypertension in her brother and sister; Thyroid disease in her mother; Valvular heart disease in her child.  ROS:   Please see the history of present illness.     All other systems reviewed and are negative.   Prior CV studies:   The following studies were reviewed today:  Echo 08/10/18: IMPRESSIONS    1. The left ventricle has normal systolic function of 88-50%. The cavity size is normal. There is no increased left ventricular wall thickness. Echo evidence of normal diastolic relaxation.  2. The right ventricle has normal systolic function. The cavity in normal in size. There is no increase in right ventricular wall thickness.  3. The mitral valve is normal in structure.  4. The aortic valve is normal in structure and function.   Lexiscan Myoview 08/10/18:  Nuclear stress EF: 60%.  The study is normal.  This is a low risk study.  The left  ventricular ejection fraction is normal (55-65%).  Normal perfusion at rest and with stress, with minimal artifact due to extracardiac activity.  No prior studies for comparison.  There was no ST segment deviation noted during stress.   Echo 03/16/17: Study Conclusions  - Left ventricle: The cavity size was normal. Posterior wall thickness was increased in a pattern of mild LVH. Systolic function was normal. The estimated ejection fraction was in the range of 55% to 60%. Wall motion was normal; there were no regional wall motion abnormalities. Left ventricular diastolic function parameters were normal. - Aortic valve: Transvalvular velocity was within the normal range. There was no stenosis. There was no regurgitation. - Mitral valve: Transvalvular velocity was within the normal range. There was no evidence for stenosis. There  was trivial regurgitation. - Right ventricle: The cavity size was normal. Wall thickness was normal. Systolic function was normal. - Tricuspid valve: There was trivial regurgitation. - Pulmonary arteries: Systolic pressure was within the normal range. PA peak pressure: 25 mm Hg (S).  Labs/Other Tests and Data Reviewed:    EKG:  No ECG reviewed.  Recent Labs: 07/29/2018: NT-Pro BNP 235 08/05/2018: Hemoglobin 9.5; Platelets 209 09/22/2018: ALT 19; BUN 48; Creatinine, Ser 4.39; Potassium 5.3; Sodium 137; TSH 1.860   Recent Lipid Panel Lab Results  Component Value Date/Time   CHOL 150 09/22/2018 11:15 AM   TRIG 93 09/22/2018 11:15 AM   HDL 39 (L) 09/22/2018 11:15 AM   CHOLHDL 3.8 09/22/2018 11:15 AM   CHOLHDL 4.4 06/18/2016 12:25 PM   LDLCALC 92 09/22/2018 11:15 AM    Wt Readings from Last 3 Encounters:  11/04/18 217 lb (98.4 kg)  09/22/18 218 lb 9.6 oz (99.2 kg)  09/21/18 216 lb 11.2 oz (98.3 kg)     Objective:    BP 113/80   Pulse (!) 110   Ht 5\' 1"  (1.549 m)   Wt 217 lb (98.4 kg)   BMI 41.00 kg/m  GENERAL:  Well-appearing.  No acute distress. HEENT: Pupils equal round.  Oral mucosa unremarkable NECK:  No jugular venous distention, no visible thyromegaly EXT:  No edema, no cyanosis no clubbing SKIN:  No rashes no nodules NEURO:  Speech fluent.  Cranial nerves grossly intact.  Moves all 4 extremities freely PSYCH:  Cognitively intact, oriented to person place and time   ASSESSMENT & PLAN:    # LE Edema:   # Shortness of breath:  Symptoms are improving with exercise.  Did stress test and echo were unremarkable 08/2018.  I suspect that her shortness of breath was due to obesity and deconditioning.  It is also improved with blood pressure control and switching her diuretic.  There is no evidence of underlying cardiac disease.  # Hypertension: Blood pressure well-controlled on amlodipine, carvedilol, hydralazine, and torsemide.  COVID-19 Education: The signs and symptoms of COVID-19 were discussed with the patient and how to seek care for testing (follow up with PCP or arrange E-visit).  The importance of social distancing was discussed today.  Time:   Today, I have spent  minutes with the patient with telehealth technology discussing the above problems.     Medication Adjustments/Labs and Tests Ordered: Current medicines are reviewed at length with the patient today.  Concerns regarding medicines are outlined above.   Tests Ordered: No orders of the defined types were placed in this encounter.   Medication Changes: No orders of the defined types were placed in this encounter.   Disposition:  Follow up prn  Signed, Skeet Latch, MD  11/04/2018 9:16 AM    [place]

## 2018-11-04 NOTE — Patient Instructions (Addendum)
Medication Instructions:  Your physician recommends that you continue on your current medications as directed. Please refer to the Current Medication list given to you today.  If you need a refill on your cardiac medications before your next appointment, please call your pharmacy.   Lab work: NONE  Testing/Procedures: NONE  Follow-Up: AS NEEDED   

## 2018-12-20 ENCOUNTER — Encounter: Payer: Self-pay | Admitting: *Deleted

## 2018-12-20 ENCOUNTER — Telehealth: Payer: Self-pay | Admitting: *Deleted

## 2018-12-20 NOTE — Telephone Encounter (Signed)
Spoke with patient and scheduled surgery for HD access. Reviewed all details for pre-op and nasal swab screening. Verbalized understanding.  Instruction letter mailed to address given.

## 2018-12-20 NOTE — Progress Notes (Signed)
Jane Santiago from [facility] requested scheduling patient for HD permanent access and [facility] ASAP.

## 2018-12-23 ENCOUNTER — Encounter: Payer: Self-pay | Admitting: Nurse Practitioner

## 2018-12-23 ENCOUNTER — Other Ambulatory Visit: Payer: Self-pay

## 2018-12-23 ENCOUNTER — Ambulatory Visit: Payer: Medicaid Other | Admitting: Nurse Practitioner

## 2018-12-23 VITALS — BP 110/80 | HR 106 | Temp 98.7°F | Ht 60.0 in | Wt 226.4 lb

## 2018-12-23 DIAGNOSIS — N184 Chronic kidney disease, stage 4 (severe): Secondary | ICD-10-CM

## 2018-12-23 DIAGNOSIS — E1122 Type 2 diabetes mellitus with diabetic chronic kidney disease: Secondary | ICD-10-CM | POA: Diagnosis not present

## 2018-12-23 DIAGNOSIS — I129 Hypertensive chronic kidney disease with stage 1 through stage 4 chronic kidney disease, or unspecified chronic kidney disease: Secondary | ICD-10-CM

## 2018-12-23 DIAGNOSIS — Z794 Long term (current) use of insulin: Secondary | ICD-10-CM

## 2018-12-23 DIAGNOSIS — I10 Essential (primary) hypertension: Secondary | ICD-10-CM

## 2018-12-23 LAB — HEMOGLOBIN A1C
Est. average glucose Bld gHb Est-mCnc: 140 mg/dL
Hgb A1c MFr Bld: 6.5 % — ABNORMAL HIGH (ref 4.8–5.6)

## 2018-12-23 MED ORDER — TORSEMIDE 20 MG PO TABS: ORAL_TABLET | ORAL | 1 refills | Status: AC

## 2018-12-23 NOTE — Progress Notes (Signed)
Subjective:     Patient ID: Jane Santiago , female    DOB: 13-Dec-1985 , 33 y.o.   MRN: [id number]   Chief Complaint  Patient presents with  . Diabetes    HPI  She is to start dialysis in the next couple of months.  She is on the transplant list at [facility].   She continues to see Dr. Hollie Salk, she reports Dr. Hollie Salk heard fluid in her lungs on 12/16/2018. Denies shortness of breath.  She is back working at [employer].  Diabetes She presents for her follow-up diabetic visit. She has type 2 diabetes mellitus. Her disease course has been stable. Pertinent negatives for hypoglycemia include no dizziness or headaches. There are no diabetic associated symptoms. Pertinent negatives for diabetes include no chest pain, no polydipsia, no polyphagia and no polyuria. Her weight is stable. She has not had a previous visit with a dietitian. She rarely participates in exercise. (Blood sugars are 70-130) An ACE inhibitor/angiotensin II receptor blocker is being taken. She does not see a podiatrist.Eye exam is current.     Past Medical History:  Diagnosis Date  . Anemia   . CKD (chronic kidney disease) stage 4, GFR 15-29 ml/min (HCC) 08/02/2018  . Diabetes mellitus without complication ([place])   . Hypertension   . Shortness of breath 08/02/2018  . Tachycardia      Family History  Problem Relation Age of Onset  . Thyroid disease Mother   . Cervical cancer Mother   . Diabetes Father   . Hypertension Brother   . Hypertension Sister   . Valvular heart disease Child      Current Outpatient Medications:  .  ACCU-CHEK FASTCLIX LANCETS MISC, Use as directed to check blood sugars 1 time per day dx: e11.65, Disp: 102 each, Rfl: 11 .  acetaminophen (TYLENOL) 325 MG tablet, Take 650 mg by mouth every 6 (six) hours as needed for mild pain or headache., Disp: , Rfl:  .  albuterol (PROVENTIL HFA;VENTOLIN HFA) 108 (90 Base) MCG/ACT inhaler, Inhale 2 puffs into the lungs every 6 (six) hours as needed for  wheezing or shortness of breath., Disp: 1 Inhaler, Rfl: 2 .  amLODipine (NORVASC) 5 MG tablet, Take 2 tablets (10 mg total) by mouth every evening., Disp: 60 tablet, Rfl: 0 .  calcitRIOL (ROCALTROL) 0.25 MCG capsule, TK 1 C PO D, Disp: , Rfl:  .  carvedilol (COREG) 25 MG tablet, Take 25 mg by mouth 2 (two) times daily with a meal. , Disp: , Rfl:  .  ferrous gluconate (FERGON) 324 MG tablet, Take 324 mg by mouth daily with breakfast., Disp: , Rfl:  .  furosemide (LASIX) 80 MG tablet, Take 80 mg by mouth 2 (two) times daily., Disp: , Rfl:  .  glucose blood (ACCU-CHEK GUIDE) test strip, Use as instructed to check blood sugars 1 time per day dx: e11.65, Disp: 100 each, Rfl: 11 .  hydrALAZINE (APRESOLINE) 25 MG tablet, Take 1 tablet (25 mg total) by mouth 3 (three) times daily., Disp: 120 tablet, Rfl: 2 .  Insulin Degludec-Liraglutide (XULTOPHY) 100-3.6 UNIT-MG/ML SOPN, Inject 32 Units into the skin daily., Disp: 5 pen, Rfl: 6 .  levonorgestrel (MIRENA) 20 MCG/24HR IUD, 1 each by Intrauterine route once., Disp: , Rfl:  .  Magnesium 200 MG TABS, Take 200 mg by mouth every morning., Disp: , Rfl:  .  Multiple Vitamins-Iron (MULTIVITAMINS WITH IRON) TABS tablet, Take 1 tablet by mouth daily. 1 tablet once a day, Disp: ,  Rfl:  .  omeprazole (PRILOSEC) 40 MG capsule, Take 40 mg by mouth daily. , Disp: , Rfl:  .  torsemide (DEMADEX) 20 MG tablet, TK 3 TS PO BID, Disp: , Rfl:  .  BD PEN NEEDLE NANO U/F 32G X 4 MM MISC, USE AS DIRECTED, Disp: 300 each, Rfl: 1   No Known Allergies   Review of Systems  Constitutional: Negative.   Respiratory: Negative.   Cardiovascular: Negative.  Negative for chest pain, palpitations and leg swelling.  Endocrine: Negative for polydipsia, polyphagia and polyuria.  Neurological: Negative for dizziness and headaches.     Today's Vitals   12/23/18 0842  BP: 110/80  Pulse: (!) 106  Temp: 98.7 F (37.1 C)  TempSrc: Oral  Weight: 226 lb 6.4 oz (102.7 kg)  Height: 5'  (1.524 m)  PainSc: 0-No pain   Body mass index is 44.22 kg/m.   Objective:  Physical Exam Constitutional:      Appearance: Normal appearance.  Cardiovascular:     Rate and Rhythm: Normal rate and regular rhythm.     Pulses: Normal pulses.     Heart sounds: Normal heart sounds. No murmur.  Pulmonary:     Effort: Pulmonary effort is normal. No respiratory distress.     Breath sounds: Normal breath sounds.  Musculoskeletal:     Right lower leg: No edema.     Left lower leg: No edema.  Skin:    General: Skin is warm and dry.     Capillary Refill: Capillary refill takes less than 2 seconds.  Neurological:     General: No focal deficit present.     Mental Status: She is alert and oriented to person, place, and time.  Psychiatric:        Mood and Affect: Mood normal.        Behavior: Behavior normal.        Thought Content: Thought content normal.        Judgment: Judgment normal.         Assessment And Plan:     1. Type 2 diabetes mellitus with stage 4 chronic kidney disease, with long-term current use of insulin (HCC)  Chronic  She is doing well with her blood sugars and her HgbA1c is improving   We may have to change her medications due to her going on dialysis - Hemoglobin A1c  2. CKD (chronic kidney disease) stage 4, GFR 15-29 ml/min (HCC)  She is schedule for a fistula and graft in the next couple of months  Will be starting dialysis as well.   3. Essential hypertension . B/P is controlled.  . CMP ordered to check renal function.  . The importance of regular exercise and dietary modification was stressed to the patient as tolerated.      Minette Brine, FNP    THE PATIENT IS ENCOURAGED TO PRACTICE SOCIAL DISTANCING DUE TO THE COVID-19 PANDEMIC.

## 2018-12-24 ENCOUNTER — Inpatient Hospital Stay (HOSPITAL_COMMUNITY): Admission: RE | Admit: 2018-12-24 | Payer: Medicaid Other | Source: Ambulatory Visit

## 2018-12-25 ENCOUNTER — Other Ambulatory Visit (HOSPITAL_COMMUNITY)
Admission: RE | Admit: 2018-12-25 | Discharge: 2018-12-25 | Disposition: A | Discharge status: Home / Self Care | Payer: Medicaid Other | Source: Ambulatory Visit | Attending: Vascular Surgery | Admitting: Vascular Surgery

## 2018-12-25 DIAGNOSIS — U071 COVID-19: Secondary | ICD-10-CM | POA: Diagnosis present

## 2018-12-26 LAB — SARS CORONAVIRUS 2 (TAT 6-24 HRS): SARS Coronavirus 2: POSITIVE — AB

## 2018-12-27 ENCOUNTER — Telehealth: Payer: Self-pay | Admitting: *Deleted

## 2018-12-27 NOTE — Telephone Encounter (Signed)
No answer, mailbox full on phone unable to leave message for patient. Left detailed message FY:BOFBP positive test for [facility].

## 2018-12-27 NOTE — Telephone Encounter (Signed)
Message left on voice mail that plan to reschedule surgery after 21 days and recovered from Covid. Instructed to call this office when ready.

## 2018-12-27 NOTE — Progress Notes (Signed)
Patient resulted positive for SARS coronovirus on 6/20. Dr Early notified. Advised that case would need to be reschedules. IB message sent to Domenic Moras RN at the office.

## 2018-12-29 ENCOUNTER — Ambulatory Visit (HOSPITAL_COMMUNITY): Admission: RE | Admit: 2018-12-29 | Payer: Medicaid Other | Source: Home / Self Care | Admitting: Vascular Surgery

## 2018-12-29 ENCOUNTER — Inpatient Hospital Stay (HOSPITAL_COMMUNITY)
Admission: EM | Admit: 2018-12-29 | Discharge: 2019-01-05 | DRG: 871 | Disposition: E | Payer: Medicaid Other | Attending: Internal Medicine | Admitting: Internal Medicine

## 2018-12-29 ENCOUNTER — Encounter (HOSPITAL_COMMUNITY): Admission: RE | Payer: Self-pay | Source: Home / Self Care

## 2018-12-29 ENCOUNTER — Emergency Department (HOSPITAL_COMMUNITY): Payer: Medicaid Other

## 2018-12-29 ENCOUNTER — Encounter (HOSPITAL_COMMUNITY): Payer: Self-pay | Admitting: Emergency Medicine

## 2018-12-29 DIAGNOSIS — D631 Anemia in chronic kidney disease: Secondary | ICD-10-CM | POA: Diagnosis present

## 2018-12-29 DIAGNOSIS — I469 Cardiac arrest, cause unspecified: Secondary | ICD-10-CM | POA: Diagnosis present

## 2018-12-29 DIAGNOSIS — I120 Hypertensive chronic kidney disease with stage 5 chronic kidney disease or end stage renal disease: Secondary | ICD-10-CM | POA: Diagnosis present

## 2018-12-29 DIAGNOSIS — R6521 Severe sepsis with septic shock: Secondary | ICD-10-CM | POA: Diagnosis present

## 2018-12-29 DIAGNOSIS — E1165 Type 2 diabetes mellitus with hyperglycemia: Secondary | ICD-10-CM

## 2018-12-29 DIAGNOSIS — E875 Hyperkalemia: Secondary | ICD-10-CM | POA: Diagnosis present

## 2018-12-29 DIAGNOSIS — I468 Cardiac arrest due to other underlying condition: Secondary | ICD-10-CM | POA: Diagnosis present

## 2018-12-29 DIAGNOSIS — Z794 Long term (current) use of insulin: Secondary | ICD-10-CM

## 2018-12-29 DIAGNOSIS — A419 Sepsis, unspecified organism: Secondary | ICD-10-CM

## 2018-12-29 DIAGNOSIS — R402312 Coma scale, best motor response, none, at arrival to emergency department: Secondary | ICD-10-CM | POA: Diagnosis present

## 2018-12-29 DIAGNOSIS — R402212 Coma scale, best verbal response, none, at arrival to emergency department: Secondary | ICD-10-CM | POA: Diagnosis present

## 2018-12-29 DIAGNOSIS — R402112 Coma scale, eyes open, never, at arrival to emergency department: Secondary | ICD-10-CM | POA: Diagnosis present

## 2018-12-29 DIAGNOSIS — Z8619 Personal history of other infectious and parasitic diseases: Secondary | ICD-10-CM

## 2018-12-29 DIAGNOSIS — E1122 Type 2 diabetes mellitus with diabetic chronic kidney disease: Secondary | ICD-10-CM | POA: Diagnosis present

## 2018-12-29 DIAGNOSIS — U071 COVID-19: Secondary | ICD-10-CM

## 2018-12-29 DIAGNOSIS — J9601 Acute respiratory failure with hypoxia: Secondary | ICD-10-CM | POA: Diagnosis present

## 2018-12-29 DIAGNOSIS — N184 Chronic kidney disease, stage 4 (severe): Secondary | ICD-10-CM

## 2018-12-29 DIAGNOSIS — J189 Pneumonia, unspecified organism: Secondary | ICD-10-CM

## 2018-12-29 DIAGNOSIS — A4189 Other specified sepsis: Principal | ICD-10-CM | POA: Diagnosis present

## 2018-12-29 DIAGNOSIS — Z452 Encounter for adjustment and management of vascular access device: Secondary | ICD-10-CM

## 2018-12-29 DIAGNOSIS — N186 End stage renal disease: Secondary | ICD-10-CM | POA: Diagnosis present

## 2018-12-29 DIAGNOSIS — Z8616 Personal history of COVID-19: Secondary | ICD-10-CM

## 2018-12-29 DIAGNOSIS — J1289 Other viral pneumonia: Secondary | ICD-10-CM | POA: Diagnosis not present

## 2018-12-29 LAB — COMPREHENSIVE METABOLIC PANEL
ALT: 86 U/L — ABNORMAL HIGH (ref 0–44)
AST: 151 U/L — ABNORMAL HIGH (ref 15–41)
Albumin: 2.4 g/dL — ABNORMAL LOW (ref 3.5–5.0)
Alkaline Phosphatase: 69 U/L (ref 38–126)
Anion gap: 16 — ABNORMAL HIGH (ref 5–15)
BUN: 98 mg/dL — ABNORMAL HIGH (ref 6–20)
CO2: 15 mmol/L — ABNORMAL LOW (ref 22–32)
Calcium: 7.2 mg/dL — ABNORMAL LOW (ref 8.9–10.3)
Chloride: 97 mmol/L — ABNORMAL LOW (ref 98–111)
Creatinine, Ser: 11.01 mg/dL — ABNORMAL HIGH (ref 0.44–1.00)
GFR calc Af Amer: 5 mL/min — ABNORMAL LOW (ref >60)
GFR calc non Af Amer: 4 mL/min — ABNORMAL LOW (ref >60)
Glucose, Bld: 384 mg/dL — ABNORMAL HIGH (ref 70–99)
Potassium: 6.7 mmol/L (ref 3.5–5.1)
Sodium: 128 mmol/L — ABNORMAL LOW (ref 135–145)
Total Bilirubin: 0.4 mg/dL (ref 0.3–1.2)
Total Protein: 6.1 g/dL — ABNORMAL LOW (ref 6.5–8.1)

## 2018-12-29 LAB — CBC WITH DIFFERENTIAL/PLATELET
Abs Immature Granulocytes: 0.15 10*3/uL — ABNORMAL HIGH (ref 0.00–0.07)
Basophils Absolute: 0 10*3/uL (ref 0.0–0.1)
Basophils Relative: 0 %
Eosinophils Absolute: 0 10*3/uL (ref 0.0–0.5)
Eosinophils Relative: 0 %
HCT: 29.6 % — ABNORMAL LOW (ref 36.0–46.0)
Hemoglobin: 8.7 g/dL — ABNORMAL LOW (ref 12.0–15.0)
Immature Granulocytes: 2 %
Lymphocytes Relative: 30 %
Lymphs Abs: 2.7 10*3/uL (ref 0.7–4.0)
MCH: 26.3 pg (ref 26.0–34.0)
MCHC: 29.4 g/dL — ABNORMAL LOW (ref 30.0–36.0)
MCV: 89.4 fL (ref 80.0–100.0)
Monocytes Absolute: 0.3 10*3/uL (ref 0.1–1.0)
Monocytes Relative: 3 %
Neutro Abs: 6 10*3/uL (ref 1.7–7.7)
Neutrophils Relative %: 65 %
Platelets: 124 10*3/uL — ABNORMAL LOW (ref 150–400)
RBC: 3.31 MIL/uL — ABNORMAL LOW (ref 3.87–5.11)
RDW: 12.9 % (ref 11.5–15.5)
WBC: 9.1 10*3/uL (ref 4.0–10.5)
nRBC: 0 % (ref 0.0–0.2)

## 2018-12-29 LAB — POCT I-STAT 7, (LYTES, BLD GAS, ICA,H+H)
Acid-base deficit: 8 mmol/L — ABNORMAL HIGH (ref 0.0–2.0)
Bicarbonate: 17 mmol/L — ABNORMAL LOW (ref 20.0–28.0)
Calcium, Ion: 0.97 mmol/L — ABNORMAL LOW (ref 1.15–1.40)
HCT: 26 % — ABNORMAL LOW (ref 36.0–46.0)
Hemoglobin: 8.8 g/dL — ABNORMAL LOW (ref 12.0–15.0)
O2 Saturation: 100 %
Patient temperature: 98.6
Potassium: 6.3 mmol/L (ref 3.5–5.1)
Sodium: 129 mmol/L — ABNORMAL LOW (ref 135–145)
TCO2: 18 mmol/L — ABNORMAL LOW (ref 22–32)
pCO2 arterial: 33.8 mmHg (ref 32.0–48.0)
pH, Arterial: 7.311 — ABNORMAL LOW (ref 7.350–7.450)
pO2, Arterial: 319 mmHg — ABNORMAL HIGH (ref 83.0–108.0)

## 2018-12-29 LAB — URINALYSIS, ROUTINE W REFLEX MICROSCOPIC
Bilirubin Urine: NEGATIVE
Glucose, UA: 50 mg/dL — AB
Hgb urine dipstick: NEGATIVE
Ketones, ur: NEGATIVE mg/dL
Leukocytes,Ua: NEGATIVE
Nitrite: NEGATIVE
Protein, ur: >=300 mg/dL — AB
Specific Gravity, Urine: 1.015 (ref 1.005–1.030)
pH: 7 (ref 5.0–8.0)

## 2018-12-29 LAB — HEMOGLOBIN A1C
Hgb A1c MFr Bld: 7.1 % — ABNORMAL HIGH (ref 4.8–5.6)
Mean Plasma Glucose: 157.07 mg/dL

## 2018-12-29 LAB — TROPONIN I (HIGH SENSITIVITY): Troponin I (High Sensitivity): 51 ng/L — ABNORMAL HIGH (ref <18)

## 2018-12-29 LAB — ETHANOL: Alcohol, Ethyl (B): <10 mg/dL (ref <10)

## 2018-12-29 LAB — SARS CORONAVIRUS 2 BY RT PCR (HOSPITAL ORDER, PERFORMED IN ~~LOC~~ HOSPITAL LAB): SARS Coronavirus 2: POSITIVE — AB

## 2018-12-29 LAB — CBG MONITORING, ED: Glucose-Capillary: 285 mg/dL — ABNORMAL HIGH (ref 70–99)

## 2018-12-29 LAB — LACTIC ACID, PLASMA
Lactic Acid, Venous: 6.4 mmol/L (ref 0.5–1.9)
Lactic Acid, Venous: 2.8 mmol/L (ref 0.5–1.9)

## 2018-12-29 SURGERY — INSERTION OF ARTERIOVENOUS (AV) GORE-TEX GRAFT ARM
Anesthesia: Monitor Anesthesia Care

## 2018-12-29 MED ORDER — SODIUM ZIRCONIUM CYCLOSILICATE 10 G PO PACK
10.0000 g | PACK | Freq: Once | ORAL | Status: AC
  Administered 2018-12-29: 10 g via ORAL
  Filled 2018-12-29: qty 1

## 2018-12-29 MED ORDER — INSULIN ASPART 100 UNIT/ML ~~LOC~~ SOLN
0.0000 [IU] | SUBCUTANEOUS | Status: DC
  Administered 2018-12-29 – 2018-12-30 (×2): 5 [IU] via SUBCUTANEOUS

## 2018-12-29 MED ORDER — DEXAMETHASONE SODIUM PHOSPHATE 10 MG/ML IJ SOLN
6.0000 mg | Freq: Every day | INTRAMUSCULAR | Status: DC
  Administered 2018-12-29: 6 mg via INTRAVENOUS
  Filled 2018-12-29: qty 1
  Filled 2018-12-29: qty 0.6

## 2018-12-29 MED ORDER — SODIUM CHLORIDE 0.9 % IV BOLUS (SEPSIS)
1000.0000 mL | Freq: Once | INTRAVENOUS | Status: AC
  Administered 2018-12-29: 1000 mL via INTRAVENOUS

## 2018-12-29 MED ORDER — PROPOFOL 1000 MG/100ML IV EMUL
INTRAVENOUS | Status: AC
  Administered 2018-12-29: 22:00:00 5 ug/kg/min via INTRAVENOUS
  Filled 2018-12-29: qty 100

## 2018-12-29 MED ORDER — STERILE WATER FOR INJECTION IV SOLN
INTRAVENOUS | Status: DC
  Administered 2018-12-29: 23:00:00 via INTRAVENOUS
  Filled 2018-12-29: qty 850

## 2018-12-29 MED ORDER — SODIUM BICARBONATE 8.4 % IV SOLN
150.0000 meq | Freq: Once | INTRAVENOUS | Status: AC
  Administered 2018-12-29: 150 meq via INTRAVENOUS
  Filled 2018-12-29: qty 100

## 2018-12-29 MED ORDER — FENTANYL CITRATE (PF) 100 MCG/2ML IJ SOLN: 50.0000 ug | INTRAMUSCULAR | Status: DC | PRN

## 2018-12-29 MED ORDER — PANTOPRAZOLE SODIUM 40 MG IV SOLR
40.0000 mg | Freq: Every day | INTRAVENOUS | Status: DC
  Administered 2018-12-29: 40 mg via INTRAVENOUS
  Filled 2018-12-29: qty 40

## 2018-12-29 MED ORDER — SODIUM CHLORIDE 0.9 % IV SOLN
2.0000 g | Freq: Once | INTRAVENOUS | Status: AC
  Administered 2018-12-29: 2 g via INTRAVENOUS
  Filled 2018-12-29: qty 2

## 2018-12-29 MED ORDER — HEPARIN SODIUM (PORCINE) 5000 UNIT/ML IJ SOLN
5000.0000 [IU] | Freq: Three times a day (TID) | INTRAMUSCULAR | Status: DC
  Administered 2018-12-29: 5000 [IU] via SUBCUTANEOUS
  Filled 2018-12-29: qty 1

## 2018-12-29 MED ORDER — VANCOMYCIN HCL IN DEXTROSE 1-5 GM/200ML-% IV SOLN: 1000.0000 mg | Freq: Once | INTRAVENOUS | Status: DC

## 2018-12-29 MED ORDER — SODIUM BICARBONATE 8.4 % IV SOLN
INTRAVENOUS | Status: AC
  Filled 2018-12-29: qty 50

## 2018-12-29 MED ORDER — PROPOFOL 1000 MG/100ML IV EMUL
0.0000 ug/kg/min | INTRAVENOUS | Status: DC
  Administered 2018-12-29 (×2): 5 ug/kg/min via INTRAVENOUS
  Administered 2018-12-30: 50 ug/kg/min via INTRAVENOUS
  Filled 2018-12-29: qty 100

## 2018-12-29 MED ORDER — CALCIUM GLUCONATE-NACL 2-0.675 GM/100ML-% IV SOLN
2.0000 g | Freq: Once | INTRAVENOUS | Status: AC
  Administered 2018-12-29: 2000 mg via INTRAVENOUS
  Filled 2018-12-29: qty 100

## 2018-12-29 MED ORDER — SODIUM CHLORIDE 0.9 % IV SOLN
1.0000 g | INTRAVENOUS | Status: DC
  Filled 2018-12-29: qty 1

## 2018-12-29 MED ORDER — VANCOMYCIN VARIABLE DOSE PER UNSTABLE RENAL FUNCTION (PHARMACIST DOSING): Status: DC

## 2018-12-29 MED ORDER — METRONIDAZOLE IN NACL 5-0.79 MG/ML-% IV SOLN
500.0000 mg | Freq: Once | INTRAVENOUS | Status: AC
  Administered 2018-12-29: 500 mg via INTRAVENOUS
  Filled 2018-12-29: qty 100

## 2018-12-29 MED ORDER — NOREPINEPHRINE 4 MG/250ML-% IV SOLN
INTRAVENOUS | Status: AC
  Administered 2018-12-29: 5 ug/min
  Filled 2018-12-29: qty 250

## 2018-12-29 MED ORDER — VANCOMYCIN HCL 10 G IV SOLR
2000.0000 mg | Freq: Once | INTRAVENOUS | Status: AC
  Administered 2018-12-29: 2000 mg via INTRAVENOUS
  Filled 2018-12-29: qty 2000

## 2018-12-29 NOTE — Progress Notes (Signed)
Transported patient to CT and back to trauma C without event.

## 2018-12-29 NOTE — ED Triage Notes (Signed)
Patient was at home, LSN 2 hrs pta of EMS.  Patient was found pulseless and apneic, PEA on monitor.  Patient is a diabetic, cbg of 522.  Patient was given CPR and 4 epi, ROSC obtained.  Sinus on monitor after ROSC obtained.  All IV access lost en route to ED.  Capnography of 38, ET tube 7.0 obtained by EMS.

## 2018-12-29 NOTE — ED Notes (Signed)
Sister, Darol Destine called- [phone] for updates

## 2018-12-29 NOTE — ED Notes (Addendum)
ETT/OGT intact , temp foley intact , NS IV bolus infusing , waiting for CT scan , IV sites unremarkable , urinary/stool incontinence care provided .

## 2018-12-29 NOTE — ED Provider Notes (Signed)
[facility] EMERGENCY DEPARTMENT Provider Note   CSN: [id number] Arrival date & time: 12/27/2018  1925    History   Chief Complaint Chief Complaint  Patient presents with  . Cardiac Arrest    HPI LAKIE MCLOUTH is a 33 y.o. female.     Patient brought in by EMS.  They were called to the scene at house.  Patient was unresponsive.  Pulse was electro activity no pulse.  No spontaneous breathing.  CPR was initiated.  Patient received several rounds of epinephrine patient was intubated at the scene.  Patient did get return of spontaneous circulation.  EMS reported no awakening or any spontaneous movement or spontaneous breathing.  Patient lost her pulses again in route.  Got more epinephrine.  Arrived here again with good palpable carotid pulses.  Patient was last seen by family members okay about 2 hours prior to them finding her unresponsive.  Is reported that patient had positive COVID testing.     Past Medical History:  Diagnosis Date  . Diabetes mellitus without complication [facility])     Patient Active Problem List   Diagnosis Date Noted  . Pneumonia due to COVID-19 virus   . Stage 4 chronic kidney disease ([place])   . History of 2019 novel coronavirus disease (COVID-19)   . Cardiac arrest ([place]) 12/28/2018    History reviewed. No pertinent surgical history.   OB History   No obstetric history on file.      Home Medications    Prior to Admission medications   Medication Sig Start Date End Date Taking? Authorizing Provider  acetaminophen (TYLENOL) 500 MG tablet Take 500-1,000 mg by mouth every 6 (six) hours as needed for mild pain or fever.   Yes [provider]  amLODipine (NORVASC) 5 MG tablet Take 10 mg by mouth every evening. 11/12/18  Yes [provider]  calcitRIOL (ROCALTROL) 0.25 MCG capsule Take 0.25 mcg by mouth daily. 10/20/18  Yes [provider]  carvedilol (COREG) 25 MG tablet Take 25 mg by mouth 2 (two) times a day.  12/12/18  Yes [provider]  hydrALAZINE (APRESOLINE) 25 MG tablet Take 25 mg by mouth 3 (three) times daily. 12/23/18  Yes [provider]  omeprazole (PRILOSEC) 40 MG capsule Take 40 mg by mouth daily before breakfast. 12/13/18  Yes [provider]  PROAIR HFA 108 (90 Base) MCG/ACT inhaler Inhale 2 puffs into the lungs every 6 (six) hours as needed for shortness of breath or wheezing. 08/02/18  Yes [provider]  torsemide (DEMADEX) 20 MG tablet Take 60 mg by mouth 2 (two) times a day. 10/20/18  Yes [provider]  XULTOPHY 100-3.6 UNIT-MG/ML SOPN Inject 20 Units into the skin daily. 12/06/18  Yes [provider]    Family History No family history on file.  Social History Social History   Tobacco Use  . Smoking status: Not on file  Substance Use Topics  . Alcohol use: Not on file  . Drug use: Not on file     Allergies   Patient has no known allergies.   Review of Systems Review of Systems  Unable to perform ROS: Patient unresponsive     Physical Exam Updated Vital Signs BP 101/70   Pulse 75   Temp (!) 95.5 F (35.3 C)   Resp (!) 21   Ht 1.6 m (5\' 3" )   Wt 99.8 kg   LMP  (LMP Unknown)   SpO2 93%   BMI  38.97 kg/m   Physical Exam Vitals signs and nursing note reviewed.  Constitutional:      General: She is not in acute distress.    Appearance: She is well-developed. She is not diaphoretic.     Comments: Patient unresponsive.  HENT:     Head: Normocephalic and atraumatic.     Mouth/Throat:     Comments: No endotracheal tube in place. Eyes:     Conjunctiva/sclera: Conjunctivae normal.     Comments: Pupils 4 mm nonreactive no corneal reflex.  Neck:     Musculoskeletal: Neck supple.     Comments: Bilateral palpable carotid pulse. Cardiovascular:     Rate and Rhythm: Normal rate and regular rhythm.     Heart sounds: Normal heart sounds. No murmur.  Pulmonary:     Effort: Pulmonary effort is normal. No  respiratory distress.     Breath sounds: Normal breath sounds. No wheezing.     Comments: Good bilateral breath sounds. Abdominal:     Palpations: Abdomen is soft.     Tenderness: There is no abdominal tenderness.  Musculoskeletal:        General: No swelling.     Comments: No palpable radial pulse.  Skin:    General: Skin is warm and dry.  Neurological:     Mental Status: She is alert.     Comments: Patient unresponsive.  Glasgow Coma Scale of 3.  Pupils 4 mm not reactive no corneal reflex.  No spontaneous movement.  No movement to any stimulus or pain stimulus.  No posturing.      ED Treatments / Results  Labs (all labs ordered are listed, but only abnormal results are displayed) Labs Reviewed  SARS CORONAVIRUS 2 ([employer]) - Abnormal; Notable for the following components:      Result Value   SARS Coronavirus 2 POSITIVE (*)    All other components within normal limits  LACTIC ACID, PLASMA - Abnormal; Notable for the following components:   Lactic Acid, Venous 6.4 (*)    All other components within normal limits  COMPREHENSIVE METABOLIC PANEL - Abnormal; Notable for the following components:   Sodium 128 (*)    Potassium 6.7 (*)    Chloride 97 (*)    CO2 15 (*)    Glucose, Bld 384 (*)    BUN 98 (*)    Creatinine, Ser 11.01 (*)    Calcium 7.2 (*)    Total Protein 6.1 (*)    Albumin 2.4 (*)    AST 151 (*)    ALT 86 (*)    GFR calc non Af Amer 4 (*)    GFR calc Af Amer 5 (*)    Anion gap 16 (*)    All other components within normal limits  CBC WITH DIFFERENTIAL/PLATELET - Abnormal; Notable for the following components:   RBC 3.31 (*)    Hemoglobin 8.7 (*)    HCT 29.6 (*)    MCHC 29.4 (*)    Platelets 124 (*)    Abs Immature Granulocytes 0.15 (*)    All other components within normal limits  URINALYSIS, ROUTINE W REFLEX MICROSCOPIC - Abnormal; Notable for the following components:   Color, Urine AMBER (*)    APPearance  CLOUDY (*)    Glucose, UA 50 (*)    Protein, ur >=300 (*)    Bacteria, UA MANY (*)    All other components within normal limits  LACTIC ACID, PLASMA - Abnormal; Notable for  the following components:   Lactic Acid, Venous 2.8 (*)    All other components within normal limits  TROPONIN I (HIGH SENSITIVITY) - Abnormal; Notable for the following components:   Troponin I (High Sensitivity) 51 (*)    All other components within normal limits  HEMOGLOBIN A1C - Abnormal; Notable for the following components:   Hgb A1c MFr Bld 7.1 (*)    All other components within normal limits  POCT I-STAT 7, (LYTES, BLD GAS, ICA,H+H) - Abnormal; Notable for the following components:   pH, Arterial 7.311 (*)    pO2, Arterial 319.0 (*)    Bicarbonate 17.0 (*)    TCO2 18 (*)    Acid-base deficit 8.0 (*)    Sodium 129 (*)    Potassium 6.3 (*)    Calcium, Ion 0.97 (*)    HCT 26.0 (*)    Hemoglobin 8.8 (*)    All other components within normal limits  CBG MONITORING, ED - Abnormal; Notable for the following components:   Glucose-Capillary 285 (*)    All other components within normal limits  CULTURE, BLOOD (ROUTINE X 2)  CULTURE, BLOOD (ROUTINE X 2)  URINE CULTURE  CULTURE, RESPIRATORY  MRSA PCR SCREENING  ETHANOL  RAPID URINE DRUG SCREEN, HOSP PERFORMED  PREGNANCY, URINE  LACTIC ACID, PLASMA  BASIC METABOLIC PANEL  BASIC METABOLIC PANEL  BASIC METABOLIC PANEL  HEPATIC FUNCTION PANEL  TROPONIN I (HIGH SENSITIVITY)  HIV ANTIBODY (ROUTINE TESTING W REFLEX)  CBC  BLOOD GAS, ARTERIAL  MAGNESIUM  PHOSPHORUS  TRIGLYCERIDES  INTERLEUKIN-6, PLASMA  BASIC METABOLIC PANEL  PROCALCITONIN  BASIC METABOLIC PANEL  I-STAT ARTERIAL BLOOD GAS, ED    EKG None  Radiology Ct Head Wo Contrast  Result Date: 12/11/2018 CLINICAL DATA:  Altered level of consciousness. Patient on respirator. COVID-19 positive EXAM: CT HEAD WITHOUT CONTRAST TECHNIQUE: Contiguous axial images were obtained from the base of the  skull through the vertex without intravenous contrast. COMPARISON:  None. FINDINGS: Note that there is a degree of motion artifact. Brain: Ventricles and sulci appear within normal limits. There is no demonstrable intracranial mass, hemorrhage, extra-axial fluid collection, or midline shift. Brain parenchyma appears unremarkable. There remains gray-white differentiation throughout the brain. No acute infarct is demonstrable with some limitation due to patient motion. Vascular: No evident hyperdense vessel. No appreciable vascular calcification. Skull: The bony calvarium appears intact. Sinuses/Orbits: There is mucosal thickening in multiple ethmoid air cells. There is nasal turbinate edema bilaterally. Orbits appear symmetric bilaterally. Other: Mastoid air cells are clear. IMPRESSION: There is a degree of motion artifact. Brain parenchyma appears unremarkable with some limitation due to the patient motion. No acute infarct is demonstrable. No mass or hemorrhage evident. There is nasal turbinate edema bilaterally as well as mucosal thickening in multiple ethmoid air cells. Electronically Signed   By: Lowella Grip III M.D.   On: 12/22/2018 21:10   Dg Chest Portable 1 View  Result Date: 12/18/2018 CLINICAL DATA:  Post CPR and intubation. Presumptive COVID-19 infection. EXAM: PORTABLE CHEST 1 VIEW COMPARISON:  None currently available. This patient's records are in the process of being merged. FINDINGS: 1935 hours. Tip of the endotracheal tube terminates just below the thoracic inlet. There is an enteric tube which projects below the diaphragm, tip not visualized. An external pacer and several overlying telemetry leads are present. The heart appears mildly enlarged. There are bilateral airspace opacities involving both upper lobes and the left lower lobe. No pleural effusion, pneumothorax or rib fracture identified. IMPRESSION:  1. Tip of the endotracheal tube appears just below the thoracic inlet and could be  advanced approximately 2 cm for more optimal positioning. The enteric tube appears satisfactorily positioned. 2. Bilateral airspace opacities consistent with pneumonia or contusion. No evidence of pneumothorax. Electronically Signed   By: Richardean Sale M.D.   On: 12/10/2018 19:58    Procedures Procedures (including critical care time)  CRITICAL CARE Performed by: Fredia Sorrow Total critical care time: 30 minutes Critical care time was exclusive of separately billable procedures and treating other patients. Critical care was necessary to treat or prevent imminent or life-threatening deterioration. Critical care was time spent personally by me on the following activities: development of treatment plan with patient and/or surrogate as well as nursing, discussions with consultants, evaluation of patient's response to treatment, examination of patient, obtaining history from patient or surrogate, ordering and performing treatments and interventions, ordering and review of laboratory studies, ordering and review of radiographic studies, pulse oximetry and re-evaluation of patient's condition.   Medications Ordered in ED Medications  ceFEPIme (MAXIPIME) 1 g in sodium chloride 0.9 % 100 mL IVPB (has no administration in time range)  vancomycin variable dose per unstable renal function (pharmacist dosing) (has no administration in time range)  insulin aspart (novoLOG) injection 0-9 Units (5 Units Subcutaneous Given 12/20/2018 2351)  heparin injection 5,000 Units (5,000 Units Subcutaneous Given 12/25/2018 2253)  pantoprazole (PROTONIX) injection 40 mg (40 mg Intravenous Given 12/06/2018 2253)  sodium bicarbonate 150 mEq in sterile water 1,000 mL infusion ( Intravenous Rate/Dose Verify 01-13-19 0000)  propofol (DIPRIVAN) 1000 MG/100ML infusion (0 mcg/kg/min  99.8 kg Intravenous Transfusing/Transfer 12/20/2018 2353)  fentaNYL (SUBLIMAZE) injection 50 mcg (has no administration in time range)  fentaNYL  (SUBLIMAZE) injection 50-200 mcg (has no administration in time range)  dexamethasone (DECADRON) injection 6 mg (6 mg Intravenous Given 12/21/2018 2253)  norepinephrine (LEVOPHED) 4-5 MG/250ML-% infusion SOLN (  Transfusing/Transfer 12/09/2018 2352)  sodium chloride 0.9 % bolus 1,000 mL (0 mLs Intravenous Stopped 12/19/2018 2034)    And  sodium chloride 0.9 % bolus 1,000 mL (0 mLs Intravenous Stopped 01/04/2019 2133)    And  sodium chloride 0.9 % bolus 1,000 mL (0 mLs Intravenous Stopped 12/22/2018 2139)  ceFEPIme (MAXIPIME) 2 g in sodium chloride 0.9 % 100 mL IVPB (0 g Intravenous Stopped 12/24/2018 2046)  metroNIDAZOLE (FLAGYL) IVPB 500 mg (0 mg Intravenous Stopped 12/26/2018 2113)  vancomycin (VANCOCIN) 2,000 mg in sodium chloride 0.9 % 500 mL IVPB (0 mg Intravenous Stopped 12/07/2018 2251)  sodium bicarbonate injection 150 mEq (150 mEq Intravenous Given 01/02/2019 2150)  calcium gluconate 2 g/ 100 mL sodium chloride IVPB (0 g Intravenous Stopped 12/11/2018 2251)  sodium zirconium cyclosilicate (LOKELMA) packet 10 g (10 g Oral Given 12/29/18 2204)     Initial Impression / Assessment and Plan / ED Course  I have reviewed the triage vital signs and the nursing notes.  Pertinent labs & imaging results that were available during my care of the patient were reviewed by me and considered in my medical decision making (see chart for details).       Patient brought in by EMS.  Patient status post cardiopulmonary arrest.  Patient was intubated in the field.  Patient arrived with spontaneous return of circulation however she was not waking up.  Pupils were 4 mm and not reactive.  There was no corneal reflex.  Patient without any movement.  Patient's blood pressures were a little marginal on arrival systolics 54-62'V is started on norepinephrine.  Also started on sepsis protocol broad-spectrum antibiotics and full 30 cc/kg fluid challenge.  Chest x-ray done here portable showed a multifocal pneumonia.  Patient had a history of  being positive for coronavirus.  Did discuss with family.  Patient became ill on Thursday.  Patient had testing done on Saturday got her results on Monday.  Today patient was feeling more short of breath.  EMS was called and patient was evaluated at the house but deemed not necessarily to come in for further evaluation since her vital signs and oxygen saturation was fine.  This was around 1:30 in the afternoon.  Patient prior to being called out for being unresponsive last was seen about 2 hours prior by family.  And then found unresponsive.  When EMS got there patient was unresponsive was in pulseless electrical activity.  CPR was initiated patient received epinephrine.  Got pulse back.  And they lost pulse again on the way in.  Got more epinephrine patient arrived with good carotid pulse.  Initial blood pressures were in the low 87O systolic.  Patient was intubated at the scene.  With a 7 oh endotracheal tube.  Patient has good bilateral breath sounds.  Patient family was consulted and discussed the circumstance with me.  Patient has a great concern for anoxic brain injury.  Chest x-ray consistent with coronavirus infection.  Patient had head CT ordered but was pending.  Discussed with the critical care specialist who will see patient for admission.  They were able to confirm her previous COVID testing so it was not reordered.  Patient's lactic acid was a little elevated at 2.8.  Initially with patient switch over the ventilator we had trouble with low sats.  She required increase in PEEP.  And we were able to get oxygen saturations up with that.  Patient's blood pressures came up to 676 systolic with low doses of norepinephrine.  IV.   Final Clinical Impressions(s) / ED Diagnoses   Final diagnoses:  Cardiopulmonary arrest ([place])  Sepsis, due to unspecified organism, unspecified whether acute organ dysfunction present [facility])  Atypical pneumonia  History of 2019 novel coronavirus disease (COVID-19)     ED Discharge Orders    None       Fredia Sorrow, MD 2019/01/06 0121

## 2018-12-29 NOTE — ED Notes (Signed)
Patient transported to CT scan . 

## 2018-12-29 NOTE — Progress Notes (Signed)
Pharmacy Antibiotic Note  Jane Santiago is a 33 y.o. female admitted on 12/27/2018 s/p cardiac arrest.  Pharmacy has been consulted for vancomycin and cefepime dosing for sepsis.  He was recently tested for covid and was positive.    Patient has CKD nearing ESRD.  He was scheduled to have an AV graft placed on 12/31/2018 and to start dialysis soon.  SCr 11, CrCL 8 ml/min, afebrile, WBC WNL, LA 6.4.  Plan: Vanc 2gm IV x 1.  F/U renal function/dialysis plans for further dosing Cefepime 2gm IV x 1, then 1gm IV Q24H Monitor renal fxn, clinical progress  Height: 5\' 3"  (160 cm) Weight: 220 lb (99.8 kg) IBW/kg (Calculated) : 52.4  Temp (24hrs), Avg:98.1 F (36.7 C), Min:97 F (36.1 C), Max:98.3 F (36.8 C)  Recent Labs  Lab 12/20/2018 1958  WBC 9.1  CREATININE 11.01*  LATICACIDVEN 6.4*    Estimated Creatinine Clearance: 8.2 mL/min (A) (by C-G formula based on SCr of 11.01 mg/dL (H)).    No Known Allergies   Vanc 6/24 >> Cefepime 6/24 >>  6/20 covid PTA - positive 6/24 BCx -  6/24 covid -   Jane Santiago, PharmD, BCPS, [place] 12/19/2018, 9:17 PM

## 2018-12-29 NOTE — H&P (Addendum)
NAME:  Jane Santiago, MRN:  [id number], DOB:  September 22, 1985, LOS: 0 ADMISSION DATE:  01/04/2019, CONSULTATION DATE:  12/15/2018 REFERRING MD:  Rogene Houston  CHIEF COMPLAINT:  Cardiac Arrest   Brief History   Jane Santiago is a 33 y.o. female who was admitted after being found in her home pulseless / PEA.  Unknown downtime prior to being found (last seen 2 hours prior), then once EMS arrived, required 25 minutes ACLS before ROSC. Of note, received COVID testing 6/20 which was positive (as part of pre-op testing for planned AV graft placement 12/19/2018 by Dr. Donnetta Hutching).  History of present illness   Pt is encephelopathic; therefore, this HPI is obtained from chart review. Jane Santiago is a 33 y.o. female who has a PMH including but not limited to HTN, CKD, DM, anemia.  She presented to [facility] ED 6/24 after being found in her home pulseless / PEA.  Unknown downtime; however, she had been seen normal 2 hours prior.  Received roughly 25 minutes of ACLS prior to ROSC.  She received COVID testing on 12/25/18 which was POSITIVE (as part of pre-op testing for planned AV graft placement 12/27/2018 by Dr. Donnetta Hutching).  Per family, pt had fever earlier that afternoon.  EMS was called and they informed family fever was likely due to [place] and that they should just let it run its course.  Pt had not had any complaints of chest pain, dyspnea, N/V/D, abd pain.  She did have cough for the past few days which was felt to be due to COVID.  Past Medical History  HTN, CKD, DM, anemia.  Significant Hospital Events   6/24 > admit.  Consults:  Nephrology.  Procedures:  ETT 6/24 >  HD cath pending 6/24 >   Significant Diagnostic Tests:  CT head 6/24 > no hemorrhage or infarct. CXR 6/24 > Bilateral infiltrates.  Micro Data:  Blood 6/24 >  Sputum 6/24 >  Urine 6/24 >  SARS CoV2 6/20 > POSITIVE  Antimicrobials:  Vanc x 1 in ED. Cefepime 6/24 >   Interim history/subjective:  On vent.  Some intermittent eye opening and arm  movements, but not purposeful.  Objective:  Blood pressure (!) 117/91, pulse 74, temperature 98.1 F (36.7 C), resp. rate 20, height 5\' 3"  (1.6 m), weight 99.8 kg, SpO2 100 %.    Vent Mode: PRVC FiO2 (%):  [97 %-100 %] 97 % Set Rate:  [18 bmp] 18 bmp Vt Set:  [450 mL] 450 mL PEEP:  [12 cmH20] 12 cmH20  No intake or output data in the 24 hours ending 12/10/2018 2038 Filed Weights   12/11/2018 1934  Weight: 99.8 kg    Examination (performed from door way with assistance of RN in room): General: Adult female, critically ill. Neuro: Not purposeful.  Intermittent eye opening but does not follow commands. HEENT: [place]/AT. Sclerae anicteric.  ETT in place. Cardiovascular: RRR, no M/R/G.  Lungs: Respirations even and unlabored.  Coarse in bases. Abdomen: BS x 4, soft, NT/ND.  Musculoskeletal: No gross deformities, no edema.  Skin: Intact, warm, no rashes.  Assessment & Plan:   Cardiac Arrest - unclear etiology at this time; though favor respiratory arrest.  Unknown downtime but once EMS arrived, required 25 minutes of ACLS before ROSC.  Echo from Feb 2020 with EF 60-65%.  UDS pending. - Admit to [facility] ICU and not GVC given cardiac arrest + ESRD. - Defer TTM given unknown downtime. - Trend troponins, lactate. - Assess echo.  Shock - presumed due to above + COVID. - Levophed as needed to maintain goal MAP > 65. - Empiric cefepime for now. - F/u on echo.  Respiratory insufficiency - due to inability to protect the airway in the setting of above. - Full vent support. - Assess ABG. - Wean as mental status improves. - Daily SBT. - Bronchial hygiene. - Follow CXR.  COVID-19 PNA.  - Start decadron 6mg  daily x 10 days. - Assess IL-6 and if elevated, consider actemra. - Defer remdesivir given CKD nearing ESRD.  Possible bacterial PNA. - Empiric cefepime for now, though consider early d/c as favor viral PNA due to COVID. - Assess PCT for re-assurance.  CKD - per notes, AV graft was  scheduled to be placed 6/24 by Dr. Donnetta Hutching and there were plans to start HD in the near future. Pseudohyponatremia - 2/2 hyperglycemia.  Corrects to ~133. - Place HD trialysis cath. - Day team to please consult Nephrology. - Will likely require CRRT temporarily before bridge to HD.  AGMA - due to above. Hyperkalemia. - 2g Ca gluconate, 3 amps HCO3, 10u insulin, 10g Lokelma. - Bicarb infusion @ 100/hr.  Anemia of chronic disease. - Transfuse for Hgb < 7.  Hyperglycemia - with underlying hx of DM.  Anticipate worsening with decadron. - SSI. - Hold preadmission xultophy.  Hx HTN. - Hold preadmission amlodipine, carvedilol, hydralazine, torsemide.   Best Practice:  Diet: NPO. Pain/Anxiety/Delirium protocol (if indicated): propofol gtt / fentanyl PRN.  RASS goal -1. VAP protocol (if indicated): In place. DVT prophylaxis: SCD's / Heparin. GI prophylaxis: PPI. Glucose control: SSI. Mobility: Bedrest. Code Status: Full. Family Communication: Family including mother Deloris Wynetta Emery and sister Arloa Prak updated over the phone by me. Disposition: ICU.  Labs   CBC: Recent Labs  Lab 12/16/2018 1958  WBC 9.1  NEUTROABS 6.0  HGB 8.7*  HCT 29.6*  MCV 89.4  PLT 017*   Basic Metabolic Panel: No results for input(s): NA, K, CL, CO2, GLUCOSE, BUN, CREATININE, CALCIUM, MG, PHOS in the last 168 hours. GFR: CrCl cannot be calculated (No successful lab value found.). Recent Labs  Lab 12/28/2018 1958  WBC 9.1   Liver Function Tests: No results for input(s): AST, ALT, ALKPHOS, BILITOT, PROT, ALBUMIN in the last 168 hours. No results for input(s): LIPASE, AMYLASE in the last 168 hours. No results for input(s): AMMONIA in the last 168 hours. ABG No results found for: PHART, PCO2ART, PO2ART, HCO3, TCO2, ACIDBASEDEF, O2SAT  Coagulation Profile: No results for input(s): INR, PROTIME in the last 168 hours. Cardiac Enzymes: No results for input(s): CKTOTAL, CKMB, CKMBINDEX, TROPONINI in the  last 168 hours. HbA1C: No results found for: HGBA1C CBG: No results for input(s): GLUCAP in the last 168 hours.  Review of Systems:   Unable to obtain as pt is encephalopathic.  Past medical history  She,  has a past medical history of Diabetes mellitus without complication ([place]).   Surgical History   History reviewed. No pertinent surgical history.   Social History      Family history   Her family history is not on file.   Allergies No Known Allergies   Home meds  Prior to Admission medications   Not on File    Critical care time: 45 min.   Montey Hora, [facility] Pager: [phone].  If no answer, [phone] 12/28/2018, 10:35 PM  Patient seen and examined, agree with above note.  I dictated the care and orders written for this patient under my direction. Prolonged cardiac arrest in the field > 25 minutes Recently diagnosed with COVID on 6/20 CXR with multilobar PNA - suspect respiratory arrest secondary to hypoxemia from COVID19 PNA. Cover for bacterial PNA CKD4 , HD AVF planned, suspect she will need HD if she survives the arrest. Place Trialysis catheter Fluids, levophed , TTE, le u/s CT brain neg  CC time 45 minutes Date of service 12/27/2018  Roxanne Mins, [place]

## 2018-12-29 NOTE — ED Notes (Signed)
ED TO INPATIENT HANDOFF REPORT  ED Nurse Name and Phone #:  Clydene Laming [phone] Name/Age/Gender Jane Santiago 33 y.o. female Room/Bed: TRACC/TRACC  Code Status   Code Status: Full Code  Home/SNF/Other: HOME  Triage Complete: Triage complete  Chief Complaint Post CPR,COVID +  Triage Note Patient was at home, LSN 2 hrs pta of EMS.  Patient was found pulseless and apneic, PEA on monitor.  Patient is a diabetic, cbg of 522.  Patient was given CPR and 4 epi, ROSC obtained.  Sinus on monitor after ROSC obtained.  All IV access lost en route to ED.  Capnography of 38, ET tube 7.0 obtained by EMS.     Allergies No Known Allergies  Level of Care/Admitting Diagnosis ED Disposition    ED Disposition Condition Comment   Admit  Hospital Area: [facility] [[id number]]  Level of Care: ICU [6]  Covid Evaluation: Confirmed COVID Positive  Isolation Risk Level: High Risk/Airborne (Aerosolizing procedure, nebulizer, intubated/ventilation, CPAP/BiPAP)  Diagnosis: Cardiac arrest ([place]) [427.5.ICD-9-CM]  Admitting Physician: Roxanne Mins [[id number]]  Attending Physician: PCCM, MD [phone]  Estimated length of stay: 5 - 7 days  Certification:: I certify this patient will need inpatient services for at least 2 midnights  PT Class (Do Not Modify): Inpatient [101]  PT Acc Code (Do Not Modify): Private [1]       B Medical/Surgery History Past Medical History:  Diagnosis Date  . Diabetes mellitus without complication ([place])    History reviewed. No pertinent surgical history.   A IV Location/Drains/Wounds Patient Lines/Drains/Airways Status   Active Line/Drains/Airways    Name:   Placement date:   Placement time:   Site:   Days:   Peripheral IV 12/14/2018 Right Antecubital   12/31/2018    1927    Antecubital   less than 1   Peripheral IV 12/31/2018 Left Antecubital   12/31/2018    1928    Antecubital   less than 1   Peripheral IV 12/17/2018 Right Wrist   12/26/2018    2226    Wrist   less  than 1   NG/OG Tube Orogastric 18 Fr. Right mouth Xray;Aucultation   12/08/2018    1933    Right mouth   less than 1   Urethral Catheter Non-latex 14 Fr.   12/27/2018    1950    Non-latex   less than 1   Airway 7 mm   12/22/2018    -     less than 1          Intake/Output Last 24 hours No intake or output data in the 24 hours ending 12/09/2018 2232  Labs/Imaging Results for orders placed or performed during the hospital encounter of 12/07/2018 (from the past 48 hour(s))  Lactic acid, plasma     Status: Abnormal   Collection Time: 12/17/2018  7:58 PM  Result Value Ref Range   Lactic Acid, Venous 6.4 (HH) 0.5 - 1.9 mmol/L    Comment: CRITICAL RESULT CALLED TO, READ BACK BY AND VERIFIED WITH: Donata Clay 2103 12/21/2018 WBOND Performed at [facility], [place] [address] [place], [place] [address]   Comprehensive metabolic panel     Status: Abnormal   Collection Time: 12/29/18  7:58 PM  Result Value Ref Range   Sodium 128 (L) 135 - 145 mmol/L   Potassium 6.7 (HH) 3.5 - 5.1 mmol/L    Comment: NO VISIBLE HEMOLYSIS CRITICAL RESULT CALLED TO, READ BACK BY  AND VERIFIED WITH: Donata Clay 2103 12/11/2018 WBOND    Chloride 97 (L) 98 - 111 mmol/L   CO2 15 (L) 22 - 32 mmol/L   Glucose, Bld 384 (H) 70 - 99 mg/dL   BUN 98 (H) 6 - 20 mg/dL   Creatinine, Ser 11.01 (H) 0.44 - 1.00 mg/dL   Calcium 7.2 (L) 8.9 - 10.3 mg/dL   Total Protein 6.1 (L) 6.5 - 8.1 g/dL   Albumin 2.4 (L) 3.5 - 5.0 g/dL   AST 151 (H) 15 - 41 U/L   ALT 86 (H) 0 - 44 U/L   Alkaline Phosphatase 69 38 - 126 U/L   Total Bilirubin 0.4 0.3 - 1.2 mg/dL   GFR calc non Af Amer 4 (L) >60 mL/min   GFR calc Af Amer 5 (L) >60 mL/min   Anion gap 16 (H) 5 - 15    Comment: Performed at [facility], [place] [address], [place], [place] [address]  CBC WITH DIFFERENTIAL     Status: Abnormal   Collection Time: 12/17/2018  7:58 PM  Result Value Ref Range   WBC 9.1 4.0 - 10.5 K/uL   RBC 3.31 (L) 3.87 - 5.11 MIL/uL   Hemoglobin 8.7 (L) 12.0  - 15.0 g/dL   HCT 29.6 (L) 36.0 - 46.0 %   MCV 89.4 80.0 - 100.0 fL   MCH 26.3 26.0 - 34.0 pg   MCHC 29.4 (L) 30.0 - 36.0 g/dL   RDW 12.9 11.5 - 15.5 %   Platelets 124 (L) 150 - 400 K/uL   nRBC 0.0 0.0 - 0.2 %   Neutrophils Relative % 65 %   Neutro Abs 6.0 1.7 - 7.7 K/uL   Lymphocytes Relative 30 %   Lymphs Abs 2.7 0.7 - 4.0 K/uL   Monocytes Relative 3 %   Monocytes Absolute 0.3 0.1 - 1.0 K/uL   Eosinophils Relative 0 %   Eosinophils Absolute 0.0 0.0 - 0.5 K/uL   Basophils Relative 0 %   Basophils Absolute 0.0 0.0 - 0.1 K/uL   Immature Granulocytes 2 %   Abs Immature Granulocytes 0.15 (H) 0.00 - 0.07 K/uL    Comment: Performed at [facility], [place] [address], [place], [place] [address]  Urinalysis, Routine w reflex microscopic     Status: Abnormal   Collection Time: 12/17/2018  7:58 PM  Result Value Ref Range   Color, Urine AMBER (A) YELLOW    Comment: BIOCHEMICALS MAY BE AFFECTED BY COLOR   APPearance CLOUDY (A) CLEAR   Specific Gravity, Urine 1.015 1.005 - 1.030   pH 7.0 5.0 - 8.0   Glucose, UA 50 (A) NEGATIVE mg/dL   Hgb urine dipstick NEGATIVE NEGATIVE   Bilirubin Urine NEGATIVE NEGATIVE   Ketones, ur NEGATIVE NEGATIVE mg/dL   Protein, ur >=300 (A) NEGATIVE mg/dL   Nitrite NEGATIVE NEGATIVE   Leukocytes,Ua NEGATIVE NEGATIVE   RBC / HPF 0-5 0 - 5 RBC/hpf   WBC, UA 0-5 0 - 5 WBC/hpf   Bacteria, UA MANY (A) NONE SEEN   Squamous Epithelial / LPF 0-5 0 - 5   Mucus PRESENT    Amorphous Crystal PRESENT     Comment: Performed at [facility], [address], [place], [place] [address]  I-STAT 7, (LYTES, BLD GAS, ICA, H+H)     Status: Abnormal   Collection Time: 12/06/2018  8:39 PM  Result Value Ref Range   pH, Arterial 7.311 (L) 7.350 - 7.450   pCO2  arterial 33.8 32.0 - 48.0 mmHg   pO2, Arterial 319.0 (H) 83.0 - 108.0 mmHg   Bicarbonate 17.0 (L) 20.0 - 28.0 mmol/L   TCO2 18 (L) 22 - 32 mmol/L   O2 Saturation 100.0 %   Acid-base deficit 8.0 (H) 0.0 - 2.0 mmol/L    Sodium 129 (L) 135 - 145 mmol/L   Potassium 6.3 (HH) 3.5 - 5.1 mmol/L   Calcium, Ion 0.97 (L) 1.15 - 1.40 mmol/L   HCT 26.0 (L) 36.0 - 46.0 %   Hemoglobin 8.8 (L) 12.0 - 15.0 g/dL   Patient temperature 98.6 F    Collection site BRACHIAL ARTERY    Sample type ARTERIAL    Comment NOTIFIED PHYSICIAN   Ethanol     Status: None   Collection Time: 12/24/2018  8:40 PM  Result Value Ref Range   Alcohol, Ethyl (B) <10 <10 mg/dL    Comment: (NOTE) Lowest detectable limit for serum alcohol is 10 mg/dL. For medical purposes only. Performed at [facility], [place] [address], [place], [place] [address]    Ct Head Wo Contrast  Result Date: 12/07/2018 CLINICAL DATA:  Altered level of consciousness. Patient on respirator. COVID-19 positive EXAM: CT HEAD WITHOUT CONTRAST TECHNIQUE: Contiguous axial images were obtained from the base of the skull through the vertex without intravenous contrast. COMPARISON:  None. FINDINGS: Note that there is a degree of motion artifact. Brain: Ventricles and sulci appear within normal limits. There is no demonstrable intracranial mass, hemorrhage, extra-axial fluid collection, or midline shift. Brain parenchyma appears unremarkable. There remains gray-white differentiation throughout the brain. No acute infarct is demonstrable with some limitation due to patient motion. Vascular: No evident hyperdense vessel. No appreciable vascular calcification. Skull: The bony calvarium appears intact. Sinuses/Orbits: There is mucosal thickening in multiple ethmoid air cells. There is nasal turbinate edema bilaterally. Orbits appear symmetric bilaterally. Other: Mastoid air cells are clear. IMPRESSION: There is a degree of motion artifact. Brain parenchyma appears unremarkable with some limitation due to the patient motion. No acute infarct is demonstrable. No mass or hemorrhage evident. There is nasal turbinate edema bilaterally as well as mucosal thickening in multiple ethmoid air  cells. Electronically Signed   By: Lowella Grip III M.D.   On: 12/28/2018 21:10   Dg Chest Portable 1 View  Result Date: 12/11/2018 CLINICAL DATA:  Post CPR and intubation. Presumptive COVID-19 infection. EXAM: PORTABLE CHEST 1 VIEW COMPARISON:  None currently available. This patient's records are in the process of being merged. FINDINGS: 1935 hours. Tip of the endotracheal tube terminates just below the thoracic inlet. There is an enteric tube which projects below the diaphragm, tip not visualized. An external pacer and several overlying telemetry leads are present. The heart appears mildly enlarged. There are bilateral airspace opacities involving both upper lobes and the left lower lobe. No pleural effusion, pneumothorax or rib fracture identified. IMPRESSION: 1. Tip of the endotracheal tube appears just below the thoracic inlet and could be advanced approximately 2 cm for more optimal positioning. The enteric tube appears satisfactorily positioned. 2. Bilateral airspace opacities consistent with pneumonia or contusion. No evidence of pneumothorax. Electronically Signed   By: Richardean Sale M.D.   On: 12/25/2018 19:58    Pending Labs Unresulted Labs (From admission, onward)    Start     Ordered   2019/01/19 0500  Hepatic function panel  Tomorrow morning,   R     12/24/2018 2134   01/19/2019 0500  CBC  Tomorrow morning,  R     12/28/2018 2159   01/11/2019 0500  Blood gas, arterial  Tomorrow morning,   R     12/17/2018 2159   Jan 11, 2019 0500  Magnesium  Tomorrow morning,   R     12/19/2018 2159   11-Jan-2019 0500  Phosphorus  Tomorrow morning,   R     12/21/2018 2159   11-Jan-2019 0500  Triglycerides  (propofol (DIPRIVAN))  Daily,   R    Comments: While on propofol (DIPRIVAN)    12/25/2018 2200   01/04/2019 2232  Procalcitonin  Once,   STAT     01/02/2019 2231   01/02/2019 2230  Lactic acid, plasma  STAT Now then every 3 hours,   R (with STAT occurrences)     12/14/2018 2134   12/19/2018 9702  Basic metabolic panel   Now then every 4 hours,   STAT     12/26/2018 2134   12/22/2018 2230  Interleukin-6, Plasma  Add-on,   AD     12/11/2018 2230   12/08/2018 2158  HIV antibody (Routine Testing)  Once,   STAT     12/11/2018 2159   12/10/2018 2158  Urine culture  Once,   STAT     12/28/2018 2159   12/26/2018 2158  Culture, respiratory (tracheal aspirate)  Once,   STAT     12/15/2018 2159   12/27/2018 2138  Troponin I (High Sensitivity)  STAT Now then every 2 hours,   R (with STAT occurrences)    Question:  Indication  Answer:  Other   12/28/2018 2137   12/28/2018 2138  Hemoglobin A1c  Once,   STAT    Comments: To assess prior glycemic control    12/24/2018 2138   01/02/2019 2058  Pregnancy, urine  Add-on,   AD     12/27/2018 2057   12/25/2018 2017  Rapid urine drug screen (hospital performed)  ONCE - STAT,   STAT     12/11/2018 2017   12/09/2018 1958  Blood Culture (routine x 2)  BLOOD CULTURE X 2,   STAT     12/12/2018 1958          Vitals/Pain Today's Vitals   12/14/2018 2110 12/27/2018 2118 12/09/2018 2137 12/17/2018 2200  BP:  91/62 102/89   Pulse: 74 75 74   Resp: (!) 23 20 (!) 22 20  Temp: (!) 97 F (36.1 C)   (!) 97.1 F (36.2 C)  TempSrc:      SpO2: 98% 100% 100%   Weight:      Height:        Isolation Precautions Airborne and Contact precautions  Medications Medications  vancomycin (VANCOCIN) 2,000 mg in sodium chloride 0.9 % 500 mL IVPB (2,000 mg Intravenous New Bag/Given 12/27/2018 2046)  ceFEPIme (MAXIPIME) 1 g in sodium chloride 0.9 % 100 mL IVPB (has no administration in time range)  vancomycin variable dose per unstable renal function (pharmacist dosing) (has no administration in time range)  calcium gluconate 2 g/ 100 mL sodium chloride IVPB (2,000 mg Intravenous New Bag/Given 12/31/2018 2200)  insulin aspart (novoLOG) injection 0-9 Units (has no administration in time range)  heparin injection 5,000 Units (has no administration in time range)  pantoprazole (PROTONIX) injection 40 mg (has no administration in time  range)  sodium bicarbonate 150 mEq in sterile water 1,000 mL infusion (has no administration in time range)  propofol (DIPRIVAN) 1000 MG/100ML infusion (5 mcg/kg/min  99.8 kg Intravenous New Bag/Given 01/03/2019 2228)  fentaNYL (SUBLIMAZE) injection  50 mcg (has no administration in time range)  fentaNYL (SUBLIMAZE) injection 50-200 mcg (has no administration in time range)  dexamethasone (DECADRON) injection 6 mg (has no administration in time range)  norepinephrine (LEVOPHED) 4-5 MG/250ML-% infusion SOLN (5 mcg/min  New Bag/Given 12/16/2018 1940)  sodium chloride 0.9 % bolus 1,000 mL (0 mLs Intravenous Stopped 12/31/2018 2034)    And  sodium chloride 0.9 % bolus 1,000 mL (0 mLs Intravenous Stopped 12/09/2018 2133)    And  sodium chloride 0.9 % bolus 1,000 mL (0 mLs Intravenous Stopped 12/25/2018 2139)  ceFEPIme (MAXIPIME) 2 g in sodium chloride 0.9 % 100 mL IVPB (0 g Intravenous Stopped 12/15/2018 2046)  metroNIDAZOLE (FLAGYL) IVPB 500 mg (0 mg Intravenous Stopped 12/28/2018 2113)  sodium bicarbonate injection 150 mEq (150 mEq Intravenous Given 12/12/2018 2150)  sodium zirconium cyclosilicate (LOKELMA) packet 10 g (10 g Oral Given 12/12/2018 2204)  propofol (DIPRIVAN) 1000 MG/100ML infusion (5 mcg/kg/min  New Bag/Given 12/14/2018 2227)    Mobility walks Low fall risk   Focused Assessments ETT/OGT INTACT , TEMP FOLEY INTACT , 3 PERIPHERAL IV    R Recommendations: See Admitting Provider Note  Report given to:   Additional Notes:

## 2018-12-29 NOTE — ED Notes (Signed)
Ett/ogt/temp foley intact , IV sites unremarkable , Propofol/Levophed drip infusing , waiting for ICU bed assignment , SodiumBicarb drip infusing at 100 ml/hr.

## 2018-12-29 NOTE — ED Notes (Signed)
Per EMS, unknown downtime, CPR started at 1837, [place] obtained at 1902.

## 2018-12-30 ENCOUNTER — Encounter: Payer: Self-pay | Admitting: Nurse Practitioner

## 2018-12-30 ENCOUNTER — Inpatient Hospital Stay (HOSPITAL_COMMUNITY): Payer: Medicaid Other

## 2018-12-30 ENCOUNTER — Encounter (HOSPITAL_COMMUNITY): Payer: Medicaid Other

## 2018-12-30 DIAGNOSIS — N184 Chronic kidney disease, stage 4 (severe): Secondary | ICD-10-CM

## 2018-12-30 DIAGNOSIS — I469 Cardiac arrest, cause unspecified: Secondary | ICD-10-CM

## 2018-12-30 DIAGNOSIS — J1289 Other viral pneumonia: Secondary | ICD-10-CM

## 2018-12-30 DIAGNOSIS — Z8616 Personal history of COVID-19: Secondary | ICD-10-CM

## 2018-12-30 DIAGNOSIS — J1282 Pneumonia due to coronavirus disease 2019: Secondary | ICD-10-CM

## 2018-12-30 DIAGNOSIS — U071 COVID-19: Secondary | ICD-10-CM

## 2018-12-30 DIAGNOSIS — Z8619 Personal history of other infectious and parasitic diseases: Secondary | ICD-10-CM

## 2018-12-30 LAB — POCT I-STAT 7, (LYTES, BLD GAS, ICA,H+H)
Acid-base deficit: 3 mmol/L — ABNORMAL HIGH (ref 0.0–2.0)
Bicarbonate: 22.9 mmol/L (ref 20.0–28.0)
Calcium, Ion: 0.97 mmol/L — ABNORMAL LOW (ref 1.15–1.40)
HCT: 29 % — ABNORMAL LOW (ref 36.0–46.0)
Hemoglobin: 9.9 g/dL — ABNORMAL LOW (ref 12.0–15.0)
O2 Saturation: 72 %
Patient temperature: 95.5
Potassium: 4.1 mmol/L (ref 3.5–5.1)
Sodium: 137 mmol/L (ref 135–145)
TCO2: 24 mmol/L (ref 22–32)
pCO2 arterial: 42.1 mmHg (ref 32.0–48.0)
pH, Arterial: 7.335 — ABNORMAL LOW (ref 7.350–7.450)
pO2, Arterial: 37 mmHg — CL (ref 83.0–108.0)

## 2018-12-30 LAB — GLUCOSE, CAPILLARY
Glucose-Capillary: 303 mg/dL — ABNORMAL HIGH (ref 70–99)
Glucose-Capillary: 328 mg/dL — ABNORMAL HIGH (ref 70–99)

## 2018-12-30 LAB — CBC
HCT: 29.4 % — ABNORMAL LOW (ref 36.0–46.0)
Hemoglobin: 9.3 g/dL — ABNORMAL LOW (ref 12.0–15.0)
MCH: 26.1 pg (ref 26.0–34.0)
MCHC: 31.6 g/dL (ref 30.0–36.0)
MCV: 82.4 fL (ref 80.0–100.0)
Platelets: 109 10*3/uL — ABNORMAL LOW (ref 150–400)
RBC: 3.57 MIL/uL — ABNORMAL LOW (ref 3.87–5.11)
RDW: 12.7 % (ref 11.5–15.5)
WBC: 3.6 10*3/uL — ABNORMAL LOW (ref 4.0–10.5)
nRBC: 0 % (ref 0.0–0.2)

## 2018-12-30 LAB — TRIGLYCERIDES: Triglycerides: 325 mg/dL — ABNORMAL HIGH (ref <150)

## 2018-12-30 LAB — RAPID URINE DRUG SCREEN, HOSP PERFORMED
Amphetamines: NOT DETECTED
Barbiturates: NOT DETECTED
Benzodiazepines: NOT DETECTED
Cocaine: NOT DETECTED
Opiates: NOT DETECTED
Tetrahydrocannabinol: NOT DETECTED

## 2018-12-30 LAB — HEPATIC FUNCTION PANEL
ALT: 72 U/L — ABNORMAL HIGH (ref 0–44)
AST: 131 U/L — ABNORMAL HIGH (ref 15–41)
Albumin: 2 g/dL — ABNORMAL LOW (ref 3.5–5.0)
Alkaline Phosphatase: 61 U/L (ref 38–126)
Bilirubin, Direct: 0.2 mg/dL (ref 0.0–0.2)
Indirect Bilirubin: 0.5 mg/dL (ref 0.3–0.9)
Total Bilirubin: 0.7 mg/dL (ref 0.3–1.2)
Total Protein: 5.3 g/dL — ABNORMAL LOW (ref 6.5–8.1)

## 2018-12-30 LAB — BASIC METABOLIC PANEL
Anion gap: 13 (ref 5–15)
BUN: 94 mg/dL — ABNORMAL HIGH (ref 6–20)
CO2: 23 mmol/L (ref 22–32)
Calcium: 6.9 mg/dL — ABNORMAL LOW (ref 8.9–10.3)
Chloride: 101 mmol/L (ref 98–111)
Creatinine, Ser: 9.46 mg/dL — ABNORMAL HIGH (ref 0.44–1.00)
GFR calc Af Amer: 6 mL/min — ABNORMAL LOW (ref >60)
GFR calc non Af Amer: 5 mL/min — ABNORMAL LOW (ref >60)
Glucose, Bld: 330 mg/dL — ABNORMAL HIGH (ref 70–99)
Potassium: 4.5 mmol/L (ref 3.5–5.1)
Sodium: 137 mmol/L (ref 135–145)

## 2018-12-30 LAB — LACTIC ACID, PLASMA: Lactic Acid, Venous: 1.5 mmol/L (ref 0.5–1.9)

## 2018-12-30 LAB — PREGNANCY, URINE: Preg Test, Ur: NEGATIVE

## 2018-12-30 LAB — MRSA PCR SCREENING: MRSA by PCR: NEGATIVE

## 2018-12-30 LAB — PHOSPHORUS: Phosphorus: 5.7 mg/dL — ABNORMAL HIGH (ref 2.5–4.6)

## 2018-12-30 LAB — MAGNESIUM: Magnesium: 2 mg/dL (ref 1.7–2.4)

## 2018-12-30 MED ORDER — NOREPINEPHRINE 4 MG/250ML-% IV SOLN
0.0000 ug/min | INTRAVENOUS | Status: DC
  Administered 2018-12-30: 4 ug/min via INTRAVENOUS
  Filled 2018-12-30: qty 250

## 2018-12-30 MED ORDER — HEPARIN SODIUM (PORCINE) 1000 UNIT/ML DIALYSIS
1000.0000 [IU] | INTRAMUSCULAR | Status: DC | PRN
  Filled 2018-12-30: qty 6

## 2018-12-30 MED ORDER — HEPARIN SOD (PORK) LOCK FLUSH 100 UNIT/ML IV SOLN
500.0000 [IU] | Freq: Once | INTRAVENOUS | Status: DC
  Filled 2018-12-30: qty 5

## 2018-12-30 MED ORDER — SODIUM CHLORIDE 0.9 % IV BOLUS
1000.0000 mL | Freq: Once | INTRAVENOUS | Status: AC
  Administered 2018-12-30: 1000 mL via INTRAVENOUS

## 2018-12-30 MED ORDER — EPINEPHRINE 1 MG/10ML IJ SOSY
0.2000 mg | PREFILLED_SYRINGE | Freq: Once | INTRAMUSCULAR | Status: AC
  Administered 2018-12-30: 0.2 mg via INTRAVENOUS

## 2018-12-30 MED ORDER — EPINEPHRINE 1 MG/10ML IJ SOSY
0.1000 mg | PREFILLED_SYRINGE | Freq: Once | INTRAMUSCULAR | Status: AC
  Administered 2018-12-30: 0.1 mg via INTRAVENOUS

## 2018-12-30 MED ORDER — VASOPRESSIN 20 UNIT/ML IV SOLN
0.0400 [IU]/min | INTRAVENOUS | Status: DC
  Filled 2018-12-30: qty 2

## 2018-12-30 MED ORDER — ATROPINE SULFATE 1 MG/10ML IJ SOSY
PREFILLED_SYRINGE | INTRAMUSCULAR | Status: AC
  Administered 2018-12-30: 1 mg
  Filled 2018-12-30: qty 10

## 2018-12-30 MED ORDER — HEPARIN SODIUM (PORCINE) 1000 UNIT/ML DIALYSIS
1000.0000 [IU] | INTRAMUSCULAR | Status: DC | PRN
  Administered 2018-12-30: 2400 [IU] via INTRAVENOUS_CENTRAL
  Filled 2018-12-30 (×2): qty 6

## 2018-12-31 MED FILL — Medication: Qty: 1 | Status: AC

## 2019-01-01 LAB — CULTURE, BLOOD (ROUTINE X 2)

## 2019-01-01 LAB — CULTURE, RESPIRATORY W GRAM STAIN: Culture: NORMAL

## 2019-01-03 ENCOUNTER — Telehealth: Payer: Self-pay | Admitting: *Deleted

## 2019-01-03 LAB — CULTURE, BLOOD (ROUTINE X 2)
Culture: NO GROWTH
Special Requests: ADEQUATE

## 2019-01-03 NOTE — Telephone Encounter (Signed)
Received original D/C from [employer], D/C forwarded to Dr.Wert to sign for Dr. Dillon Bjork

## 2019-01-04 NOTE — Telephone Encounter (Signed)
Received original signed D/C-funeral home notified for pick up.

## 2019-01-05 NOTE — Progress Notes (Signed)
[facility] Progress Note Patient Name: Jane Santiago DOB: [birth year] MRN: [id number]   Date of Service  2019-01-24  HPI/Events of Note  Pt admitted via ED with coma, out of hospital cardiac arrest, Covid-19 pneumonia, and acute respiratory failure requiring intubation and mechanical ventilation.  eICU Interventions  New patient evaluation completed        Frederik Pear 01/24/2019, 1:02 AM

## 2019-01-05 NOTE — Progress Notes (Signed)
[facility] Progress Note Patient Name: Jane Santiago DOB: [birth year] MRN: [id number]   Date of Service  01-22-2019  HPI/Events of Note  Pt needs Heplock flush for dialysis catheter  eICU Interventions  Heplock flush order entered        Frederik Pear Jan 22, 2019, 1:49 AM

## 2019-01-05 NOTE — Progress Notes (Signed)
Sputum culture collected, sent to lab.  

## 2019-01-05 NOTE — Progress Notes (Signed)
[facility] Progress Note Patient Name: Jane Santiago DOB: [birth year] MRN: [id number]   Date of Service  21-Jan-2019  HPI/Events of Note  I was called to the room for SBP  56 mmHg  eICU Interventions  Epinephrine 50 mcg bolus x 1 followed by 100 mcg bolus x 1 then 200 mcg bolus x 1, NS 1000 ml iv fluid bolus, Norepinephrine maxed out, Vasopressin 0.04 ordered, Pt ultimately required ACLS protocol for PEA, Ground team arrived and took over resuscitation        Jane Santiago 01/21/2019, 5:14 AM

## 2019-01-05 NOTE — Progress Notes (Signed)
PCCM Interval Progress Note  Called to bedside for hypotension, bradycardia, desaturations.  At the time of my arrival, ACLS protocol underway and had been performed for 10 minutes.  ROSC obtained.  I called family to update them on events and discuss goals of care. While on the phone, pt had PEA arrest again.  Roughly 8-10 minutes additional ACLS.  Called family back and updated them.  While on the phone, pt had bradycardia down into the teens.  Discussed goals of care with family and informed them that in light of third arrest with initial downtime likely over 25 minutes, prognosis for meaningful recovery would be near zero and that additional CPR would be futile. While on the phone, team notified me that they were not able to detect pulses.  I informed family that pt had expired.  Time of death 4.  Brother who used to be a Clinical biochemist and currently lives in [place] expressed understanding. He thanked the team for our care and all that we have done.  He asked me to hang up so that he could discuss things and support his family through their loss.  Emotional support offered.    Montey Hora, [facility] Pager: [phone].  If no answer, [phone] 2019/01/04, 5:40 AM

## 2019-01-05 NOTE — Progress Notes (Addendum)
Patient suddenly became hypotensive and bradycardic despite having levophed at max dose. Elink MD notified, gave orders to give Epi and atropine. Code blue was eventually called. Ground team on unit.   CPR was performed with ACLS drugs given. Despite all measures done, Code blue was called off. Time of death 05:33. Ground team notified family.

## 2019-01-05 NOTE — Progress Notes (Signed)
RT got ABG, believed to be venous, very dark, ran ABG PO2 was 37. RT will attempt to get another ABG .

## 2019-01-05 NOTE — Procedures (Signed)
Hemodialysis Catheter Insertion Procedure Note CHAIRTY TOMAN [id number] August 10, 1985  Procedure: Insertion of Hemodialysis Catheter Indications: CRRT  Procedure Details Consent: Risks of procedure as well as the alternatives and risks of each were explained to the (patient/caregiver).  Consent for procedure obtained. Time Out: Verified patient identification, verified procedure, site/side was marked, verified correct patient position, special equipment/implants available, medications/allergies/relevent history reviewed, required imaging and test results available.  Performed  Maximum sterile technique was used including antiseptics, cap, gloves, gown, hand hygiene, mask and sheet. Skin prep: Chlorhexidine; local anesthetic administered A antimicrobial bonded/coated triple lumen catheter was placed in the left internal jugular vein using the Seldinger technique.  Evaluation Blood flow good Complications: No apparent complications Patient did tolerate procedure well. Chest X-ray ordered to verify placement.  CXR: pending.  Procedure performed under direct ultrasound guidance for real time vessel cannulation.      Montey Hora, [facility] Pgr: [phone]  or [phone] 01-21-19, 1:12 AM

## 2019-01-05 DEATH — deceased

## 2019-03-08 NOTE — Discharge Summary (Signed)
Physician Discharge Summary   Patient ID: Jane Santiago MRN: [id number] DOB/AGE: 1986/03/11 33 y.o.  Admit date: 12/20/2018 Discharge date: 2019/01/28                     Discharge Plan by Diagnosis  Patient expired     Discharge Summary    Jane Santiago is a 33 y.o. y/o female w PMH including but not limited to HTN, CKD, DM, anemia.  She presented to [facility] ED 6/24 after being found in her home pulseless / PEA.  Unknown downtime; however, she had been seen normal 2 hours prior.  Received roughly 25 minutes of ACLS prior to ROSC.  She received COVID testing on 12/25/18 which was POSITIVE (as part of pre-op testing for planned AV graft placement 12/22/2018 by Dr. Donnetta Hutching).  Per family, pt had fever earlier that afternoon.  EMS was called and they informed family fever was likely due to [place] and that they should just let it run its course.  Pt had not had any complaints of chest pain, dyspnea, N/V/D, abd pain.  She did have cough for the past few days which was felt to be due to COVID.  Admitted to the ICU , given fluids , pressors , abx and full vent support. She remained anuric and profoundly hypotensive with evidence of cardiogenic shock and multilobar pneumonia with severe ARDS presumed secondary to COVID19 pneumonia. She arrested 3 times in the ICU and after third cardiac arrest family made her DNR over the phone. She went again into PEA. Family informed over the phone and all questions answered.          Significant Diagnostic Studies  COVID19 +   Procedures (surgical and bedside):  HD catheter    Objective:  Blood pressure 121/87, pulse (!) 115, temperature (!) 95.9 F (35.5 C), resp. rate 18, height 5\' 3"  (1.6 m), weight 99.8 kg, SpO2 100 %.       No intake or output data in the 24 hours ending 02/06/19 2013 Filed Weights   12/31/2018 1934 Jan 28, 2019 0900  Weight: 99.8 kg 99.8 kg    Physical Examination: expired   Discharge Labs:  BMET No results for input(s): NA, K, CL, CO2,  GLUCOSE, BUN, CREATININE, CALCIUM, MG, PHOS in the last 168 hours.  CBC No results for input(s): HGB, HCT, WBC, PLT in the last 168 hours.  Anti-Coagulation No results for input(s): INR in the last 168 hours.           Disposition: expired    Time spent on discharge: 25 minutes     02/06/2019, 8:13 PM

## 2019-03-30 ENCOUNTER — Ambulatory Visit: Payer: Medicaid Other | Admitting: Nurse Practitioner

## 2019-07-29 IMAGING — MR MR LUMBAR SPINE W/O CM
5 series · 45 of 48 positions shown · non-contrast
Comparison: None.

CLINICAL DATA: Back pain, cauda equina suspected. Fecal
incontinence

Creatinine was obtained on site at [HOSPITAL] at [HOSPITAL].
Results: Creatinine 4.2 mg/dL. GFR 15. No contrast administered due
to poor renal function.
EXAM:
MRI LUMBAR SPINE WITHOUT CONTRAST
TECHNIQUE: Multiplanar, multisequence MR imaging of the lumbar spine was
performed. No intravenous contrast was administered.

[Series 3: T2 · sagittal · 4.0mm · 0.88mm/px · 6 of 15 slices shown (1 of 2)]
[im 1/15]
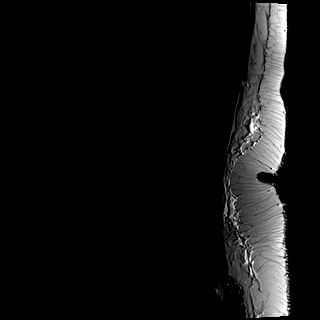
[im 3/15]
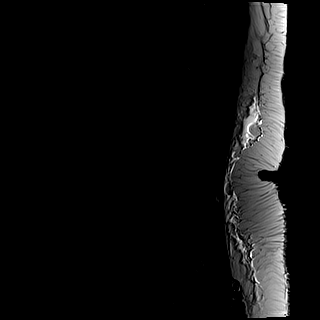
[im 6/15]
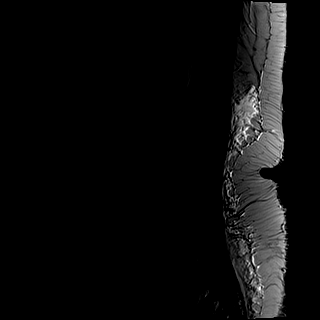
[im 9/15]
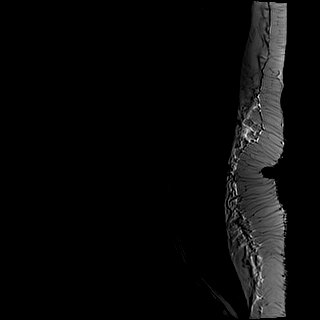
[im 12/15]
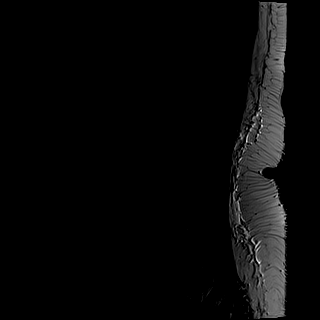
[im 15/15]
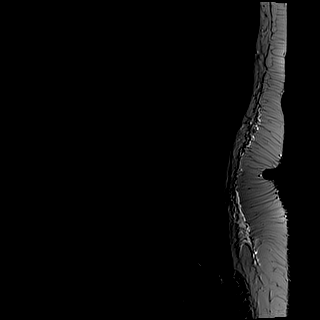

[Series 4: T1 · sagittal · 4.0mm · 0.88mm/px · 6 of 15 slices shown (1 of 2)]
[im 1/15]
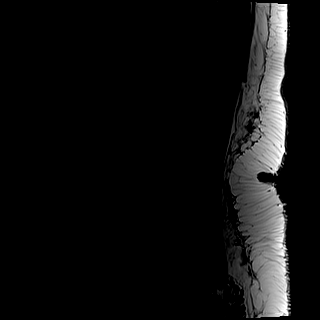
[im 3/15]
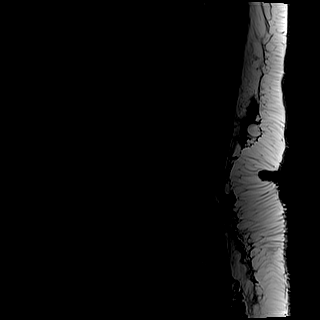
[im 6/15]
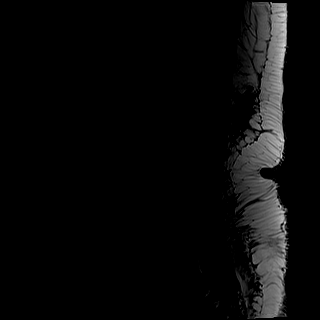
[im 9/15]
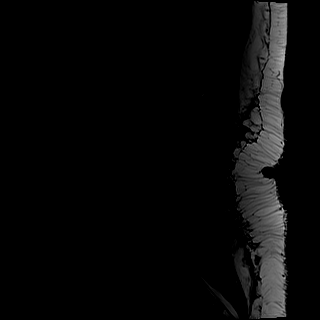
[im 12/15]
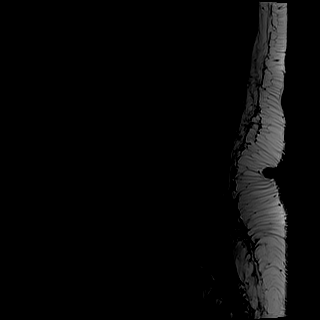
[im 15/15]
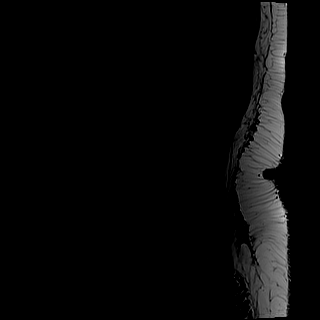

[Series 5: tirm sag · sagittal · 4.0mm · 0.55mm/px · 6 of 15 slices shown]
[im 1/15]
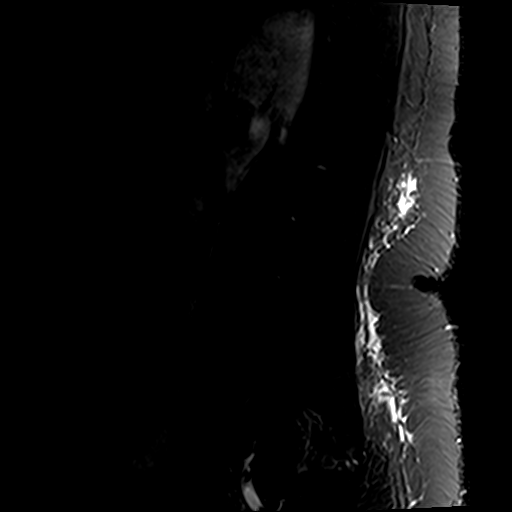
[im 3/15]
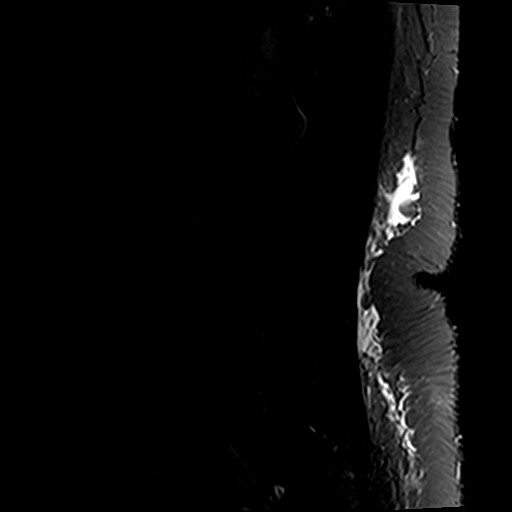
[im 6/15]
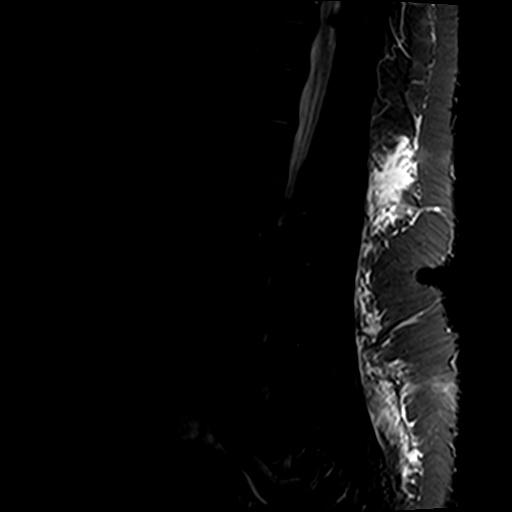
[im 9/15]
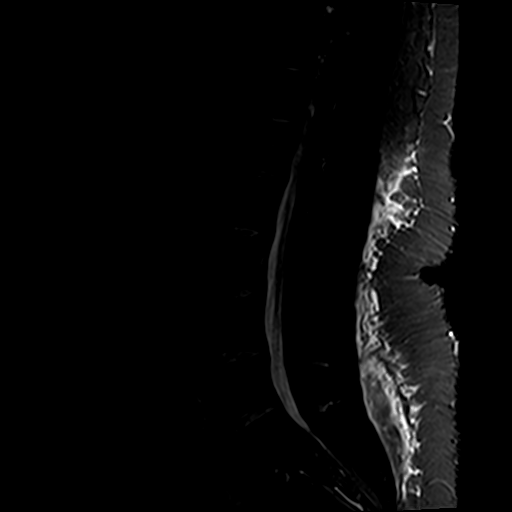
[im 12/15]
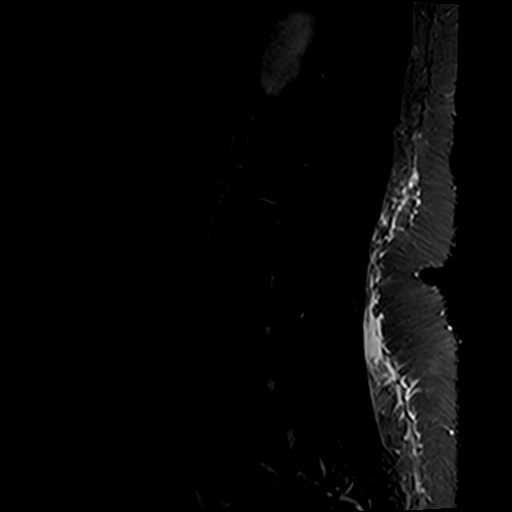
[im 15/15]
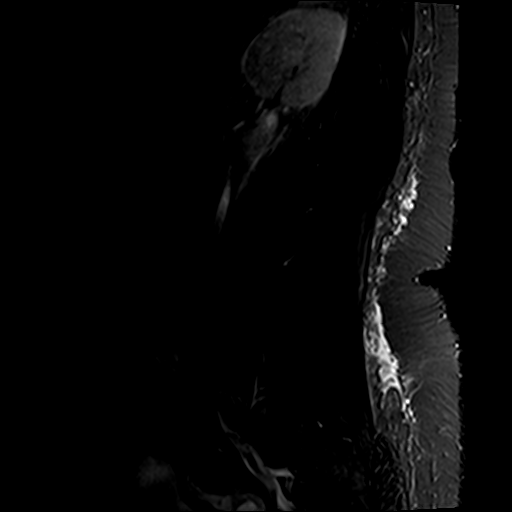

[Series 6: T1 · axial · 4.0mm · 0.78mm/px · z∈[-2,+207]mm · 12 of 36 slices shown (2 of 2)]
[im 1/36]
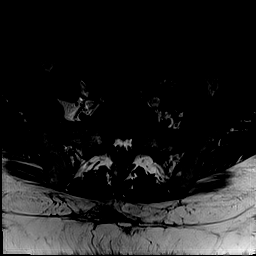
[im 3/36]
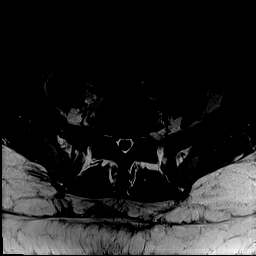
[im 6/36]
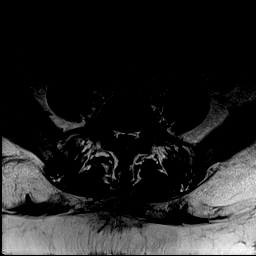
[im 8/36]
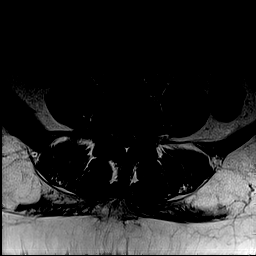
[im 11/36]
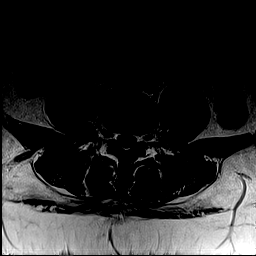
[im 13/36]
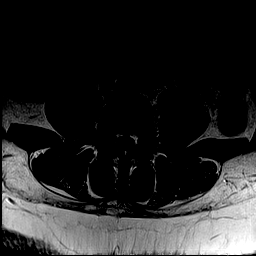
[im 16/36]
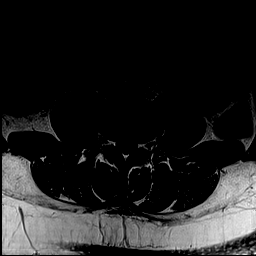
[im 18/36]
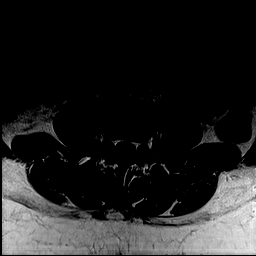
[im 21/36]
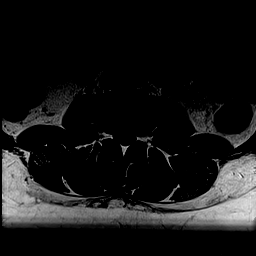
[im 26/36]
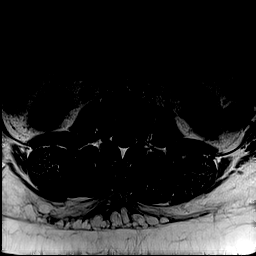
[im 31/36]
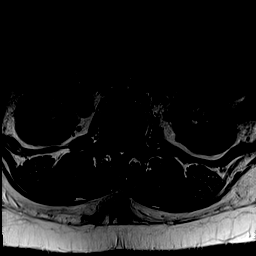
[im 36/36]
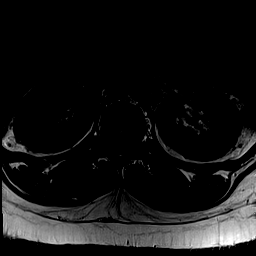

[Series 7: T2 · axial · 4.0mm · 0.78mm/px · z∈[-2,+207]mm · 15 of 36 slices shown (2 of 2)]
[im 1/36]
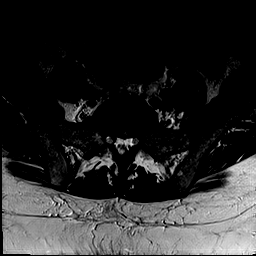
[im 3/36]
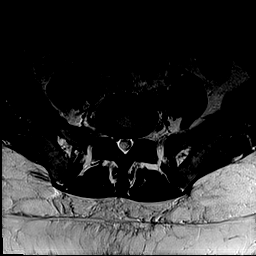
[im 6/36]
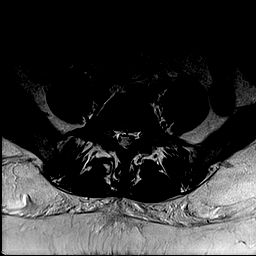
[im 8/36]
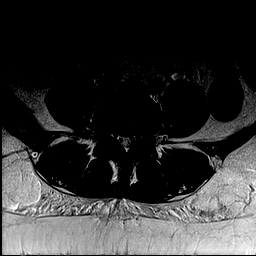
[im 11/36]
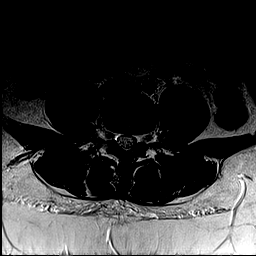
[im 13/36]
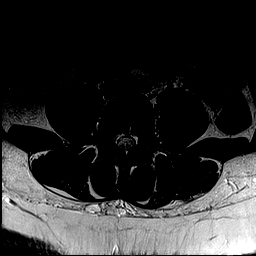
[im 16/36]
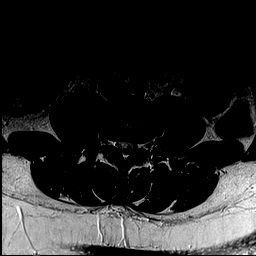
[im 18/36]
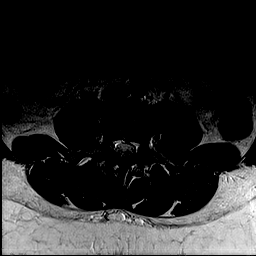
[im 21/36]
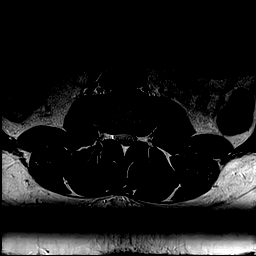
[im 23/36]
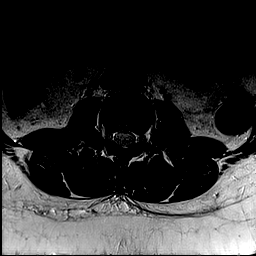
[im 26/36]
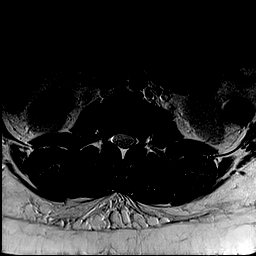
[im 28/36]
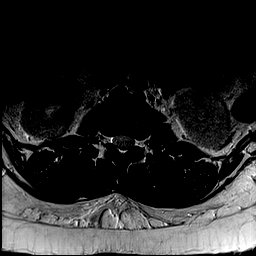
[im 31/36]
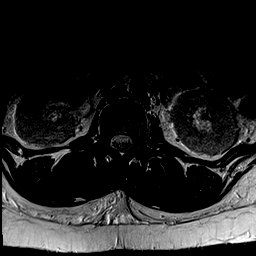
[im 33/36]
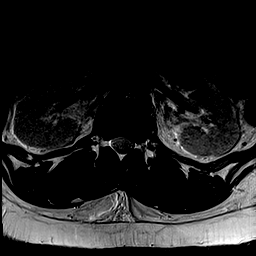
[im 36/36]
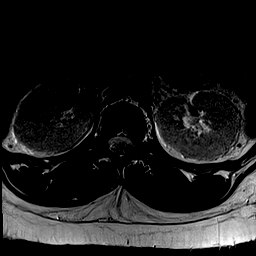

[45 of 48 positions shown; findings below may reference images not displayed]

FINDINGS: Segmentation:  Normal

Alignment:  Normal

Vertebrae:  Normal bone marrow.  Negative for fracture or mass.

Conus medullaris and cauda equina: Conus extends to the T12-L1
level. Conus and cauda equina appear normal.

Paraspinal and other soft tissues: Negative for paraspinous mass or
adenopathy.

Disc levels:

Mild congenital lumbar stenosis.

T12-L1: Negative

L1-2: Negative

L2-3: Mild spinal stenosis.  Mild facet hypertrophy bilaterally

L3-4: Mild spinal stenosis with mild facet hypertrophy bilaterally

L4-5: Diffuse bulging of the disc with mild facet hypertrophy. Mild
spinal stenosis. Moderate subarticular stenosis bilaterally

L5-S1: Mild facet degeneration bilaterally without significant
stenosis.
IMPRESSION: Mild congenital lumbar stenosis. Mild spinal stenosis at L2-3 and
L3-4.

Mild spinal stenosis L4-5 with moderate subarticular stenosis
bilaterally.

Note is made of GFR 15.  No intravenous contrast administered.

## 2019-09-28 ENCOUNTER — Encounter: Payer: Medicaid Other | Admitting: Nurse Practitioner

## 2019-09-29 ENCOUNTER — Encounter: Payer: Medicaid Other | Admitting: Nurse Practitioner
